# Patient Record
Sex: Male | Born: 1980 | Hispanic: No | Marital: Married | Smoking: Never smoker
Health system: Southern US, Community
[De-identification: ages and names within clinical notes are randomized; demographics above are authoritative.]

## PROBLEM LIST (undated history)

## (undated) DIAGNOSIS — N200 Calculus of kidney: Secondary | ICD-10-CM

## (undated) DIAGNOSIS — I10 Essential (primary) hypertension: Secondary | ICD-10-CM

## (undated) HISTORY — PX: URETERAL STENT PLACEMENT: SHX822

## (undated) HISTORY — PX: WISDOM TOOTH EXTRACTION: SHX21

## (undated) HISTORY — PX: VASECTOMY: SHX75

---

## 2017-12-29 ENCOUNTER — Encounter (HOSPITAL_BASED_OUTPATIENT_CLINIC_OR_DEPARTMENT_OTHER): Payer: Self-pay | Admitting: *Deleted

## 2017-12-29 ENCOUNTER — Other Ambulatory Visit: Payer: Self-pay

## 2017-12-29 ENCOUNTER — Emergency Department (HOSPITAL_BASED_OUTPATIENT_CLINIC_OR_DEPARTMENT_OTHER)
Admission: EM | Admit: 2017-12-29 | Discharge: 2017-12-30 | Disposition: A | Discharge status: Home / Self Care | Payer: 59 | Attending: Emergency Medicine | Admitting: Emergency Medicine

## 2017-12-29 ENCOUNTER — Emergency Department (HOSPITAL_BASED_OUTPATIENT_CLINIC_OR_DEPARTMENT_OTHER): Payer: 59

## 2017-12-29 DIAGNOSIS — I10 Essential (primary) hypertension: Secondary | ICD-10-CM | POA: Insufficient documentation

## 2017-12-29 DIAGNOSIS — Z87442 Personal history of urinary calculi: Secondary | ICD-10-CM | POA: Diagnosis not present

## 2017-12-29 DIAGNOSIS — R109 Unspecified abdominal pain: Secondary | ICD-10-CM | POA: Insufficient documentation

## 2017-12-29 DIAGNOSIS — R110 Nausea: Secondary | ICD-10-CM | POA: Diagnosis not present

## 2017-12-29 HISTORY — DX: Calculus of kidney: N20.0

## 2017-12-29 HISTORY — DX: Essential (primary) hypertension: I10

## 2017-12-29 LAB — URINALYSIS, ROUTINE W REFLEX MICROSCOPIC
Bilirubin Urine: NEGATIVE
Glucose, UA: NEGATIVE mg/dL
Hgb urine dipstick: NEGATIVE
KETONES UR: NEGATIVE mg/dL
LEUKOCYTES UA: NEGATIVE
NITRITE: NEGATIVE
Protein, ur: NEGATIVE mg/dL
Specific Gravity, Urine: >1.03 — ABNORMAL HIGH (ref 1.005–1.030)
pH: 6 (ref 5.0–8.0)

## 2017-12-29 MED ORDER — OXYCODONE-ACETAMINOPHEN 5-325 MG PO TABS
1.0000 | ORAL_TABLET | Freq: Once | ORAL | Status: AC
  Administered 2017-12-29: 1 via ORAL
  Filled 2017-12-29: qty 1

## 2017-12-29 MED ORDER — CYCLOBENZAPRINE HCL 10 MG PO TABS: 10.0000 mg | ORAL_TABLET | Freq: Three times a day (TID) | ORAL | 0 refills | Status: DC | PRN

## 2017-12-29 MED ORDER — HYDROCODONE-ACETAMINOPHEN 5-325 MG PO TABS: 1.0000 | ORAL_TABLET | Freq: Four times a day (QID) | ORAL | 0 refills | Status: DC | PRN
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
## 2017-12-29 NOTE — ED Triage Notes (Signed)
Pt reports right flank pain x 1 week. ?kidney infection/problem

## 2018-04-26 ENCOUNTER — Ambulatory Visit: Payer: 59 | Admitting: Family Medicine

## 2018-04-26 ENCOUNTER — Encounter: Payer: Self-pay | Admitting: Family Medicine

## 2018-04-26 ENCOUNTER — Other Ambulatory Visit: Payer: Self-pay

## 2018-04-26 VITALS — BP 140/100 | HR 73 | Temp 98.7°F | Ht 75.0 in | Wt 288.1 lb

## 2018-04-26 DIAGNOSIS — M542 Cervicalgia: Secondary | ICD-10-CM | POA: Diagnosis not present

## 2018-04-26 DIAGNOSIS — I10 Essential (primary) hypertension: Secondary | ICD-10-CM | POA: Diagnosis not present

## 2018-04-26 LAB — BASIC METABOLIC PANEL
BUN: 16 mg/dL (ref 6–23)
CO2: 26 mEq/L (ref 19–32)
Calcium: 9.8 mg/dL (ref 8.4–10.5)
Chloride: 103 mEq/L (ref 96–112)
Creatinine, Ser: 1.04 mg/dL (ref 0.40–1.50)
GFR: 79.59 mL/min (ref >60.00)
GLUCOSE: 76 mg/dL (ref 70–99)
POTASSIUM: 4.5 meq/L (ref 3.5–5.1)
Sodium: 138 mEq/L (ref 135–145)

## 2018-04-26 MED ORDER — LISINOPRIL 20 MG PO TABS: 20.0000 mg | ORAL_TABLET | Freq: Every day | ORAL | 3 refills | Status: DC

## 2018-04-26 NOTE — Progress Notes (Signed)
Chief Complaint  Patient presents with  . New Patient (Initial Visit)       New Patient Visit SUBJECTIVE: HPI: Ryan Casey is an 38 y.o.male who is being seen for establishing care.  Hypertension Patient presents for hypertension follow up. He does not currently monitor home blood pressures. He is not currently on any medication.  He is usually adhering to a healthy diet overall-is a snacker. Exercise: nothing  Several mo hx of RUE paresthesias. A/w R sided neck pain. No inj or change in activity. Positionally related. Nothing on inside of arm, outside. Has a wrist splint. No weakness.  No Known Allergies  Past Medical History:  Diagnosis Date  . Hypertension   . Kidney stone    Past Surgical History:  Procedure Laterality Date  . URETERAL STENT PLACEMENT    . VASECTOMY    . WISDOM TOOTH EXTRACTION     Family History  Problem Relation Age of Onset  . Heart disease Mother   . Hyperlipidemia Father    No Known Allergies  Current Outpatient Medications:  .  lisinopril (PRINIVIL,ZESTRIL) 20 MG tablet, Take 1 tablet (20 mg total) by mouth daily., Disp: 30 tablet, Rfl: 3  ROS Cardiovascular: Denies chest pain  Respiratory: Denies dyspnea   OBJECTIVE: BP (!) 140/100 (BP Location: Left Arm, Patient Position: Sitting, Cuff Size: Large)   Pulse 73   Temp 98.7 F (37.1 C) (Oral)   Ht 6\' 3"  (1.905 m)   Wt 288 lb 2 oz (130.7 kg)   SpO2 97%   BMI 36.01 kg/m   Constitutional: -  VS reviewed -  Well developed, well nourished, appears stated age -  No apparent distress  Psychiatric: -  Oriented to person, place, and time -  Memory intact -  Affect and mood normal -  Fluent conversation, good eye contact -  Judgment and insight age appropriate  Eye: -  Conjunctivae clear, no discharge -  Pupils symmetric, round, reactive to light  Neck: -  No gross swelling, no palpable masses -  Thyroid midline, not enlarged, mobile, no palpable masses  Cardiovascular: -  RRR -  No  bruits -  No LE edema  Respiratory: -  Normal respiratory effort, no accessory muscle use, no retraction -  Breath sounds equal, no wheezes, no ronchi, no crackles  Neurological:  -  CN II - XII grossly intact -  Neg Phalen's and Tinel's -  Sensation intact to pinprick, equal bilaterally  Musculoskeletal: -  No clubbing, no cyanosis -  Gait normal  Skin: -  No significant lesion on inspection -  Warm and dry to palpation   ASSESSMENT/PLAN: Essential hypertension - Plan: Basic metabolic panel, Basic metabolic panel  Neck pain  Restart ACEi. Counseled on diet and exercise. Monitor BP at home. Healthy diet handout given, offered to refer to dietician.  Not in right location for neurogen TOS. Will tx neck issue and see if it resolves issue. Heat, stretches/exercises. Will refer to sports med if no improvement.  Patient should return in 1 mo for HTN ck, 1 week for labs. The patient voiced understanding and agreement to the plan.   Jilda Roche [place], DO 04/26/18  8:31 AM

## 2018-04-26 NOTE — Patient Instructions (Addendum)
Heat (pad or rice pillow in microwave) over affected area, 10-15 minutes twice daily.   Keep the diet clean and stay active.  Aim to do some physical exertion for 150 minutes per week. This is typically divided into 5 days per week, 30 minutes per day. The activity should be enough to get your heart rate up. Anything is better than nothing if you have time constraints.  If you want to see a nutritionist/dietician at any time, let me know.   Healthy Eating Plan Many factors influence your heart health, including eating and exercise habits. Heart (coronary) risk increases with abnormal blood fat (lipid) levels. Heart-healthy meal planning includes limiting unhealthy fats, increasing healthy fats, and making other small dietary changes. This includes maintaining a healthy body weight to help keep lipid levels within a normal range.  WHAT IS MY PLAN?  Your health care provider recommends that you:  Drink a glass of water before meals to help with satiety.  Eat slowly.  An alternative to the water is to add Metamucil. This will help with satiety as well. It does contain calories, unlike water.  WHAT TYPES OF FAT SHOULD I CHOOSE?  Choose healthy fats more often. Choose monounsaturated and polyunsaturated fats, such as olive oil and canola oil, flaxseeds, walnuts, almonds, and seeds.  Eat more omega-3 fats. Good choices include salmon, mackerel, sardines, tuna, flaxseed oil, and ground flaxseeds. Aim to eat fish at least two times each week.  Avoid foods with partially hydrogenated oils in them. These contain trans fats. Examples of foods that contain trans fats are stick margarine, some tub margarines, cookies, crackers, and other baked goods. If you are going to avoid a fat, this is the one to avoid!  WHAT GENERAL GUIDELINES DO I NEED TO FOLLOW?  Check food labels carefully to identify foods with trans fats. Avoid these types of options when possible.  Fill one half of your plate with  vegetables and green salads. Eat 4-5 servings of vegetables per day. A serving of vegetables equals 1 cup of raw leafy vegetables,  cup of raw or cooked cut-up vegetables, or  cup of vegetable juice.  Fill one fourth of your plate with whole grains. Look for the word "whole" as the first word in the ingredient list.  Fill one fourth of your plate with lean protein foods.  Eat 4-5 servings of fruit per day. A serving of fruit equals one medium whole fruit,  cup of dried fruit,  cup of fresh, frozen, or canned fruit. Try to avoid fruits in cups/syrups as the sugar content can be high.  Eat more foods that contain soluble fiber. Examples of foods that contain this type of fiber are apples, broccoli, carrots, beans, peas, and barley. Aim to get 20-30 g of fiber per day.  Eat more home-cooked food and less restaurant, buffet, and fast food.  Limit or avoid alcohol.  Limit foods that are high in starch and sugar.  Avoid fried foods when able.  Cook foods by using methods other than frying. Baking, boiling, grilling, and broiling are all great options. Other fat-reducing suggestions include: ? Removing the skin from poultry. ? Removing all visible fats from meats. ? Skimming the fat off of stews, soups, and gravies before serving them. ? Steaming vegetables in water or broth.  Lose weight if you are overweight. Losing just 5-10% of your initial body weight can help your overall health and prevent diseases such as diabetes and heart disease.  Increase your consumption  of nuts, legumes, and seeds to 4-5 servings per week. One serving of dried beans or legumes equals  cup after being cooked, one serving of nuts equals 1 ounces, and one serving of seeds equals  ounce or 1 tablespoon.  WHAT ARE GOOD FOODS CAN I EAT? Grains Grainy breads (try to find bread that is 3 g of fiber per slice or greater), oatmeal, light popcorn. Whole-grain cereals. Rice and pasta, including brown rice and those  that are made with whole wheat. Edamame pasta is a great alternative to grain pasta. It has a higher protein content. Try to avoid significant consumption of white bread, sugary cereals, or pastries/baked goods.  Vegetables All vegetables. Cooked white potatoes do not count as vegetables.  Fruits All fruits, but limit pineapple and bananas as these fruits have a higher sugar content.  Meats and Other Protein Sources Lean, well-trimmed beef, veal, pork, and lamb. Chicken and [place] without skin. All fish and shellfish. Wild duck, rabbit, pheasant, and venison. Egg whites or low-cholesterol egg substitutes. Dried beans, peas, lentils, and tofu.Seeds and most nuts.  Dairy Low-fat or nonfat cheeses, including ricotta, string, and mozzarella. Skim or 1% milk that is liquid, powdered, or evaporated. Buttermilk that is made with low-fat milk. Nonfat or low-fat yogurt. Soy/Almond milk are good alternatives if you cannot handle dairy.  Beverages Water is the best for you. Sports drinks with less sugar are more desirable unless you are a highly active athlete.  Sweets and Desserts Sherbets and fruit ices. Honey, jam, marmalade, jelly, and syrups. Dark chocolate.  Eat all sweets and desserts in moderation.  Fats and Oils Nonhydrogenated (trans-free) margarines. Vegetable oils, including soybean, sesame, sunflower, olive, peanut, safflower, corn, canola, and cottonseed. Salad dressings or mayonnaise that are made with a vegetable oil. Limit added fats and oils that you use for cooking, baking, salads, and as spreads.  Other Cocoa powder. Coffee and tea. Most condiments.  The items listed above may not be a complete list of recommended foods or beverages. Contact your dietitian for more options.   EXERCISES RANGE OF MOTION (ROM) AND STRETCHING EXERCISES  These exercises may help you when beginning to rehabilitate your issue. In order to successfully resolve your symptoms, you must improve your  posture. These exercises are designed to help reduce the forward-head and rounded-shoulder posture which contributes to this condition. Your symptoms may resolve with or without further involvement from your physician, physical therapist or athletic trainer. While completing these exercises, remember:   Restoring tissue flexibility helps normal motion to return to the joints. This allows healthier, less painful movement and activity.  An effective stretch should be held for at least 20 seconds, although you may need to begin with shorter hold times for comfort.  A stretch should never be painful. You should only feel a gentle lengthening or release in the stretched tissue.  Do not do any stretch or exercise that you cannot tolerate.  STRETCH- Axial Extensors  Lie on your back on the floor. You may bend your knees for comfort. Place a rolled-up hand towel or dish towel, about 2 inches in diameter, under the part of your head that makes contact with the floor.  Gently tuck your chin, as if trying to make a "double chin," until you feel a gentle stretch at the base of your head.  Hold 15-20 seconds. Repeat 2-3 times. Complete this exercise 1 time per day.   STRETCH - Axial Extension   Stand or sit on a firm  surface. Assume a good posture: chest up, shoulders drawn back, abdominal muscles slightly tense, knees unlocked (if standing) and feet hip width apart.  Slowly retract your chin so your head slides back and your chin slightly lowers. Continue to look straight ahead.  You should feel a gentle stretch in the back of your head. Be certain not to feel an aggressive stretch since this can cause headaches later.  Hold for 15-20 seconds. Repeat 2-3 times. Complete this exercise 1 time per day.  STRETCH - Cervical Side Bend   Stand or sit on a firm surface. Assume a good posture: chest up, shoulders drawn back, abdominal muscles slightly tense, knees unlocked (if standing) and feet hip width  apart.  Without letting your nose or shoulders move, slowly tip your right / left ear to your shoulder until your feel a gentle stretch in the muscles on the opposite side of your neck.  Hold 15-20 seconds. Repeat 2-3 times. Complete this exercise 1-2 times per day.  STRETCH - Cervical Rotators   Stand or sit on a firm surface. Assume a good posture: chest up, shoulders drawn back, abdominal muscles slightly tense, knees unlocked (if standing) and feet hip width apart.  Keeping your eyes level with the ground, slowly turn your head until you feel a gentle stretch along the back and opposite side of your neck.  Hold 15-20 seconds. Repeat 2-3 times. Complete this exercise 1-2 times per day.  RANGE OF MOTION - Neck Circles   Stand or sit on a firm surface. Assume a good posture: chest up, shoulders drawn back, abdominal muscles slightly tense, knees unlocked (if standing) and feet hip width apart.  Gently roll your head down and around from the back of one shoulder to the back of the other. The motion should never be forced or painful.  Repeat the motion 10-20 times, or until you feel the neck muscles relax and loosen. Repeat 2-3 times. Complete the exercise 1-2 times per day. STRENGTHENING EXERCISES - Cervical Strain and Sprain These exercises may help you when beginning to rehabilitate your injury. They may resolve your symptoms with or without further involvement from your physician, physical therapist, or athletic trainer. While completing these exercises, remember:   Muscles can gain both the endurance and the strength needed for everyday activities through controlled exercises.  Complete these exercises as instructed by your physician, physical therapist, or athletic trainer. Progress the resistance and repetitions only as guided.  You may experience muscle soreness or fatigue, but the pain or discomfort you are trying to eliminate should never worsen during these exercises. If this  pain does worsen, stop and make certain you are following the directions exactly. If the pain is still present after adjustments, discontinue the exercise until you can discuss the trouble with your clinician.  STRENGTH - Cervical Flexors, Isometric  Face a wall, standing about 6 inches away. Place a small pillow, a ball about 6-8 inches in diameter, or a folded towel between your forehead and the wall.  Slightly tuck your chin and gently push your forehead into the soft object. Push only with mild to moderate intensity, building up tension gradually. Keep your jaw and forehead relaxed.  Hold 10 to 20 seconds. Keep your breathing relaxed.  Release the tension slowly. Relax your neck muscles completely before you start the next repetition. Repeat 2-3 times. Complete this exercise 1 time per day.  STRENGTH- Cervical Lateral Flexors, Isometric   Stand about 6 inches away from a wall.  Place a small pillow, a ball about 6-8 inches in diameter, or a folded towel between the side of your head and the wall.  Slightly tuck your chin and gently tilt your head into the soft object. Push only with mild to moderate intensity, building up tension gradually. Keep your jaw and forehead relaxed.  Hold 10 to 20 seconds. Keep your breathing relaxed.  Release the tension slowly. Relax your neck muscles completely before you start the next repetition. Repeat 2-3 times. Complete this exercise 1 time per day.  STRENGTH - Cervical Extensors, Isometric   Stand about 6 inches away from a wall. Place a small pillow, a ball about 6-8 inches in diameter, or a folded towel between the back of your head and the wall.  Slightly tuck your chin and gently tilt your head back into the soft object. Push only with mild to moderate intensity, building up tension gradually. Keep your jaw and forehead relaxed.  Hold 10 to 20 seconds. Keep your breathing relaxed.  Release the tension slowly. Relax your neck muscles  completely before you start the next repetition. Repeat 2-3 times. Complete this exercise 1 time per day.  POSTURE AND BODY MECHANICS CONSIDERATIONS Keeping correct posture when sitting, standing or completing your activities will reduce the stress put on different body tissues, allowing injured tissues a chance to heal and limiting painful experiences. The following are general guidelines for improved posture. Your physician or physical therapist will provide you with any instructions specific to your needs. While reading these guidelines, remember:  The exercises prescribed by your provider will help you have the flexibility and strength to maintain correct postures.  The correct posture provides the optimal environment for your joints to work. All of your joints have less wear and tear when properly supported by a spine with good posture. This means you will experience a healthier, less painful body.  Correct posture must be practiced with all of your activities, especially prolonged sitting and standing. Correct posture is as important when doing repetitive low-stress activities (typing) as it is when doing a single heavy-load activity (lifting).  PROLONGED STANDING WHILE SLIGHTLY LEANING FORWARD When completing a task that requires you to lean forward while standing in one place for a long time, place either foot up on a stationary 2- to 4-inch high object to help maintain the best posture. When both feet are on the ground, the low back tends to lose its slight inward curve. If this curve flattens (or becomes too large), then the back and your other joints will experience too much stress, fatigue more quickly, and can cause pain.   RESTING POSITIONS Consider which positions are most painful for you when choosing a resting position. If you have pain with flexion-based activities (sitting, bending, stooping, squatting), choose a position that allows you to rest in a less flexed posture. You would  want to avoid curling into a fetal position on your side. If your pain worsens with extension-based activities (prolonged standing, working overhead), avoid resting in an extended position such as sleeping on your stomach. Most people will find more comfort when they rest with their spine in a more neutral position, neither too rounded nor too arched. Lying on a non-sagging bed on your side with a pillow between your knees, or on your back with a pillow under your knees will often provide some relief. Keep in mind, being in any one position for a prolonged period of time, no matter how correct your posture, can  still lead to stiffness.  WALKING Walk with an upright posture. Your ears, shoulders, and hips should all line up. OFFICE WORK When working at a desk, create an environment that supports good, upright posture. Without extra support, muscles fatigue and lead to excessive strain on joints and other tissues.  CHAIR:  A chair should be able to slide under your desk when your back makes contact with the back of the chair. This allows you to work closely.  The chair's height should allow your eyes to be level with the upper part of your monitor and your hands to be slightly lower than your elbows.  Body position: ? Your feet should make contact with the floor. If this is not possible, use a foot rest. ? Keep your ears over your shoulders. This will reduce stress on your neck and low back.

## 2018-05-29 ENCOUNTER — Ambulatory Visit: Payer: 59 | Admitting: Family Medicine

## 2018-06-18 ENCOUNTER — Encounter: Payer: Self-pay | Admitting: Family Medicine

## 2018-06-19 ENCOUNTER — Other Ambulatory Visit: Payer: Self-pay

## 2018-06-19 ENCOUNTER — Ambulatory Visit (INDEPENDENT_AMBULATORY_CARE_PROVIDER_SITE_OTHER): Payer: 59 | Admitting: Family Medicine

## 2018-06-19 ENCOUNTER — Encounter: Payer: Self-pay | Admitting: Family Medicine

## 2018-06-19 DIAGNOSIS — I10 Essential (primary) hypertension: Secondary | ICD-10-CM | POA: Diagnosis not present

## 2018-06-19 DIAGNOSIS — N50812 Left testicular pain: Secondary | ICD-10-CM

## 2018-06-19 DIAGNOSIS — G4731 Primary central sleep apnea: Secondary | ICD-10-CM | POA: Insufficient documentation

## 2018-06-19 DIAGNOSIS — R0681 Apnea, not elsewhere classified: Secondary | ICD-10-CM

## 2018-06-19 NOTE — Progress Notes (Addendum)
Chief Complaint  Patient presents with  . Hypertension    Subjective Ryan Casey is a 38 y.o. male who presents for hypertension follow up. Due to COVID-19 pandemic, we are interacting via web portal for an electronic face-to-face visit. I verified patient's ID using 2 identifiers. Patient agreed to proceed with visit via this method. Patient is at home, I am at office. Patient and I are present for visit.  He does monitor home blood pressures. Blood pressures ranging from 120-160's/90-100's on average. He is not compliant with medication- lisinopril 20 mg/d. His work schedule causes him to forget or miss doses.  Patient has these side effects of medication: none He is adhering to a healthy diet overall. Current exercise: none  Wife tells him that he snores at night. He also stops breathing per her as well. He has never had a sleep study. +fatigue, but he also works both nights and days depending on the week.  +L testicle pain over past year. No inj or change in activity, did have a vasectomy and feels bumps from after the procedure. 1 sexual partner in wife, denies skin lesions, fevers, or urinary complaints. He wears boxer briefs with reported good support. He is not a cyclist.   Past Medical History:  Diagnosis Date  . Hypertension   . Kidney stone     Review of Systems Cardiovascular: no chest pain Respiratory:  no shortness of breath  Exam No conversational dyspnea Age appropriate judgment and insight Nml affect and mood   Essential hypertension  Witnessed apneic spells - Plan: Ambulatory referral to Pulmonology  Left testicular pain  1- Counseled on diet and exercise. Keep ACEi the same for now, pt would like to buckle down w compliance for now. He will let us know if BP does not come down. 2- probably OSA, refer to sleep team. Could help with #1 3- questionable etiology, likely not torsion or infection given duration. Ice, NSAIDs, Tylenol, wear supportive  undergarments over next 1-2 weeks. If no improvement, will refer to urology, also offered to bring him in for more formal eval and urine.  F/u in 6 mo for CPE if all else is controlled. The patient voiced understanding and agreement to the plan.  Jilda Roche [place], DO 06/19/18  3:49 PM

## 2018-07-11 ENCOUNTER — Ambulatory Visit: Payer: 59 | Admitting: Internal Medicine

## 2018-07-11 ENCOUNTER — Encounter: Payer: Self-pay | Admitting: Internal Medicine

## 2018-07-11 ENCOUNTER — Other Ambulatory Visit: Payer: Self-pay

## 2018-07-11 VITALS — BP 140/80 | HR 73 | Temp 98.0°F | Ht 75.0 in | Wt 288.0 lb

## 2018-07-11 DIAGNOSIS — G4733 Obstructive sleep apnea (adult) (pediatric): Secondary | ICD-10-CM | POA: Diagnosis not present

## 2018-07-11 DIAGNOSIS — I10 Essential (primary) hypertension: Secondary | ICD-10-CM | POA: Diagnosis not present

## 2018-07-11 DIAGNOSIS — R0681 Apnea, not elsewhere classified: Secondary | ICD-10-CM | POA: Diagnosis not present

## 2018-07-11 NOTE — Progress Notes (Signed)
07/11/2018  37 yoM never smoker for sleep evaluation. referred by Dr. Carmelia Roller (PCP) for witnessed apneic episodes by spouse; pt reports having difficulties falling asleep, reports "broken sleep". 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 Medical problem list includes HBP Epworth score 8 Body weight today 288 lbs Rotates 12 hour day/night schedule every 2 weeks. Has black-out curtains, white noise, melatonin, no caffeine.  "Hate taking medicines" and job requires drug screen. Loud snoring.  No ENT surgery, heart or lung disease.  Prior to Admission medications   Medication Sig Start Date End Date Taking? Authorizing Provider  lisinopril (PRINIVIL,ZESTRIL) 20 MG tablet Take 1 tablet (20 mg total) by mouth daily. 04/26/18   Sharlene Dory, DO   Past Medical History:  Diagnosis Date  . Hypertension   . Kidney stone    Past Surgical History:  Procedure Laterality Date  . URETERAL STENT PLACEMENT    . VASECTOMY    . WISDOM TOOTH EXTRACTION     Family History  Problem Relation Age of Onset  . Heart disease Mother   . Hyperlipidemia Father    Social History   Socioeconomic History  . Marital status: Married    Spouse name: Not on file  . Number of children: Not on file  . Years of education: Not on file  . Highest education level: Not on file  Occupational History  . Not on file  Social Needs  . Financial resource strain: Not on file  . Food insecurity:    Worry: Not on file    Inability: Not on file  . Transportation needs:    Medical: Not on file    Non-medical: Not on file  Tobacco Use  . Smoking status: Never Smoker  . Smokeless tobacco: Current User    Types: Snuff  Substance and Sexual Activity  . Alcohol use: Not Currently  . Drug use: Never  . Sexual activity: Not on file  Lifestyle  . Physical activity:    Days per week: Not on file    Minutes per session: Not on file  . Stress: Not on file  Relationships  . Social connections:    Talks on  phone: Not on file    Gets together: Not on file    Attends religious service: Not on file    Active member of club or organization: Not on file    Attends meetings of clubs or organizations: Not on file    Relationship status: Not on file  . Intimate partner violence:    Fear of current or ex partner: Not on file    Emotionally abused: Not on file    Physically abused: Not on file    Forced sexual activity: Not on file  Other Topics Concern  . Not on file  Social History Narrative  . Not on file   ROS-see HPI   + = positive Constitutional:    weight loss, night sweats, fevers, chills, fatigue, lassitude. HEENT:    headaches, difficulty swallowing, tooth/dental problems, sore throat,       sneezing, itching, ear ache, nasal congestion, post nasal drip, snoring CV:    chest pain, orthopnea, PND, swelling in lower extremities, anasarca,                                  dizziness, palpitations Resp:   shortness of breath with exertion or at rest.  productive cough,   non-productive cough, coughing up of blood.              change in color of mucus.  wheezing.   Skin:    rash or lesions. GI:  No-   heartburn, indigestion, abdominal pain, nausea, vomiting, diarrhea,                 change in bowel habits, loss of appetite GU: dysuria, change in color of urine, no urgency or frequency.   flank pain. MS:   joint pain, stiffness, decreased range of motion, back pain. Neuro-     nothing unusual Psych:  change in mood or affect.  depression or anxiety.   memory loss.  OBJ- Physical Exam General- Alert, Oriented, Affect-appropriate, Distress- none acute, stocky/ overweight Skin- rash-none, lesions- none, excoriation- none Lymphadenopathy- none Head- atraumatic            Eyes- Gross vision intact, PERRLA, conjunctivae and secretions clear            Ears- Hearing, canals-normal            Nose- Clear, no-Septal dev, mucus, polyps, erosion, perforation             Throat-  Mallampati III , mucosa clear , drainage- none, tonsils- atrophic Neck- flexible , trachea midline, no stridor , thyroid nl, carotid no bruit Chest - symmetrical excursion , unlabored           Heart/CV- RRR , no murmur , no gallop  , no rub, nl s1 s2                           - JVD- none , edema- none, stasis changes- none, varices- none           Lung- clear to P&A, wheeze- none, cough- none , dullness-none, rub- none           Chest wall-  Abd-  Br/ Gen/ Rectal- Not done, not indicated Extrem- cyanosis- none, clubbing, none, atrophy- none, strength- nl Neuro- grossly intact to observation

## 2018-07-11 NOTE — Patient Instructions (Signed)
Order- please schedule unattended home sleep test    Dx OSA  Please call me about 2 weeks after your sleep test, for results and recommendations. If appropriate, we may be able to start treatment before we see you next.  

## 2018-07-12 NOTE — Assessment & Plan Note (Signed)
Discussed relationship of OSA toHBP

## 2018-07-12 NOTE — Assessment & Plan Note (Signed)
High probability he has medically significant OSA. Appropriate discussion, education/ responsibility to drive safely.  Plan- sleep study, then likely CPAP

## 2018-07-29 ENCOUNTER — Ambulatory Visit: Payer: 59

## 2018-07-29 ENCOUNTER — Other Ambulatory Visit: Payer: Self-pay

## 2018-07-29 DIAGNOSIS — G4733 Obstructive sleep apnea (adult) (pediatric): Secondary | ICD-10-CM

## 2018-07-31 ENCOUNTER — Encounter: Payer: Self-pay | Admitting: Family Medicine

## 2018-07-31 DIAGNOSIS — G4733 Obstructive sleep apnea (adult) (pediatric): Secondary | ICD-10-CM | POA: Diagnosis not present

## 2018-08-01 ENCOUNTER — Other Ambulatory Visit: Payer: Self-pay

## 2018-08-01 ENCOUNTER — Telehealth: Payer: Self-pay | Admitting: Family Medicine

## 2018-08-01 ENCOUNTER — Telehealth: Payer: Self-pay | Admitting: *Deleted

## 2018-08-01 DIAGNOSIS — Z20822 Contact with and (suspected) exposure to covid-19: Secondary | ICD-10-CM

## 2018-08-01 NOTE — Telephone Encounter (Signed)
Pt scheduled for covid testing today @ 11:00am @ [facility]. Instructions given and order placed.

## 2018-08-01 NOTE — Telephone Encounter (Signed)
Please set this patient up for testing.

## 2018-08-05 LAB — NOVEL CORONAVIRUS, NAA: SARS-CoV-2, NAA: NOT DETECTED

## 2018-08-14 ENCOUNTER — Telehealth: Payer: Self-pay | Admitting: Internal Medicine

## 2018-08-14 DIAGNOSIS — G4733 Obstructive sleep apnea (adult) (pediatric): Secondary | ICD-10-CM

## 2018-08-14 NOTE — Telephone Encounter (Signed)
Pt had sleep study performed 6/22 and is calling requesting to know the results. Dr. Annamaria Boots, please advise on this. Thank you!

## 2018-08-15 NOTE — Telephone Encounter (Signed)
Called & spoke w/ pt regarding CY's results and recommendations. Pt verbalized understanding with no additional questions. Order for new CPAP/DME start has been placed. Nothing further needed at this time.

## 2018-08-15 NOTE — Telephone Encounter (Signed)
Home sleep test showed moderately severe obstructive sleep apnea, averaging 22 apneas/ hour, with drops in blood oxygen level. As discussed at office visit, I recommend we start CPAP.  Order- new DME, new CPAP auto 5-20, mask of choice, humidifier, supplies, AirView Please verify that he has a return ov scheduled in 31-90 days per insurance regs.

## 2018-08-19 ENCOUNTER — Encounter: Payer: Self-pay | Admitting: Family Medicine

## 2018-08-20 ENCOUNTER — Other Ambulatory Visit: Payer: Self-pay | Admitting: Family Medicine

## 2018-08-20 ENCOUNTER — Telehealth: Payer: Self-pay | Admitting: Family Medicine

## 2018-08-20 MED ORDER — LISINOPRIL 20 MG PO TABS: 20.0000 mg | ORAL_TABLET | Freq: Every day | ORAL | 3 refills | Status: DC

## 2018-08-20 NOTE — Telephone Encounter (Signed)
Patient sent message today to see if DME can be changed to [facility]. Message below.  I still haven't heard anything regarding my cpap machine. If possible will you send the order to [facility]. My wife uses them and they seem to be pretty good with her supplies and cpap machine.   Thanks   Message routed to [facility] to advise

## 2018-08-20 NOTE — Telephone Encounter (Signed)
Refill done.  

## 2018-08-20 NOTE — Telephone Encounter (Signed)
I sent message to Sonia Baller at [facility] to disregard cpap order and I will send order to [facility].  Please send MyChart message to pt to make him aware.

## 2018-10-31 ENCOUNTER — Encounter: Payer: Self-pay | Admitting: Internal Medicine

## 2018-11-04 ENCOUNTER — Other Ambulatory Visit: Payer: Self-pay

## 2018-11-04 ENCOUNTER — Ambulatory Visit: Payer: 59 | Admitting: Internal Medicine

## 2018-11-04 ENCOUNTER — Encounter: Payer: Self-pay | Admitting: Internal Medicine

## 2018-11-04 DIAGNOSIS — G4731 Primary central sleep apnea: Secondary | ICD-10-CM | POA: Diagnosis not present

## 2018-11-04 DIAGNOSIS — G4726 Circadian rhythm sleep disorder, shift work type: Secondary | ICD-10-CM | POA: Insufficient documentation

## 2018-11-04 NOTE — Assessment & Plan Note (Signed)
CPAP download shows residual AHI 3.2/ hr, with no significant central component. Appropriate to treat this as OSA. He benefits from CPAP. Plan- continue CPAP auto 5-20. Education done re goals, comfort.

## 2018-11-04 NOTE — Patient Instructions (Signed)
We can continue CPAP auto 5-20, mask of choice, humidifier,supplies, Airview/ card  Please call if we can help 

## 2018-11-04 NOTE — Progress Notes (Signed)
07/11/2018  37 yoM never smoker for sleep evaluation. referred by Dr. Nani Ravens (PCP) for witnessed apneic episodes by spouse; pt reports having difficulties falling asleep, reports "broken sleep". 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 Medical problem list includes HBP Epworth score 8 Body weight today 288 lbs Rotates 12 hour day/night schedule every 2 weeks. Has black-out curtains, white noise, melatonin, no caffeine.  "Hate taking medicines" and job requires drug screen. Loud snoring.  No ENT surgery, heart or lung disease.  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 Complicated by Obesity, HBP. HST- 07/29/2018- AHI 22.3/ hr, Complex (Central> Obstructive), desaturation to 78%, body weight 288 lbs CPAP  5-20/ [facility] -----OSA on CPAP, DME: [facility]; no complaints, reports not sleeping with machine for the past few nights d/t working swing shift Body weight today 279 lbs Download compliance 73%, AHI 3.2/ hr.  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  Dieted off 9 lbs.  Declines flu vax.  ROS-see HPI   + = positive Constitutional:    +weight loss, night sweats, fevers, chills, fatigue, lassitude. HEENT:    headaches, difficulty swallowing, tooth/dental problems, sore throat,       sneezing, itching, ear ache, nasal congestion, post nasal drip, snoring CV:    chest pain, orthopnea, PND, swelling in lower extremities, anasarca,                                  dizziness, palpitations Resp:   shortness of breath with exertion or at rest.                productive cough,   non-productive cough, coughing up of blood.              change in color of mucus.  wheezing.   Skin:    rash or lesions. GI:  No-   heartburn, indigestion, abdominal pain, nausea, vomiting, diarrhea,                change in bowel habits, loss of appetite GU: dysuria, change in color of urine, no urgency or frequency.   flank pain. MS:   joint pain, stiffness, decreased range of motion, back pain. Neuro-     nothing unusual Psych:  change in mood or affect.  depression or anxiety.   memory loss.  OBJ- Physical Exam General- Alert, Oriented, Affect-appropriate, Distress- none acute, tall/ muscular Skin- rash-none, lesions- none, excoriation- none Lymphadenopathy- none Head- atraumatic            Eyes- Gross vision intact, PERRLA, conjunctivae and secretions clear            Ears- Hearing, canals-normal            Nose- Clear, no-Septal dev, mucus, polyps, erosion, perforation             Throat- Mallampati III , mucosa clear , drainage- none, tonsils- atrophic Neck- flexible , trachea midline, no stridor , thyroid nl, carotid no bruit Chest - symmetrical excursion , unlabored           Heart/CV- RRR , no murmur , no gallop  , no rub, nl s1 s2                           -  JVD- none , edema- none, stasis changes- none, varices- none           Lung- clear to P&A, wheeze- none, cough- none , dullness-none, rub- none           Chest wall-  Abd-  Br/ Gen/ Rectal- Not done, not indicated Extrem- cyanosis- none, clubbing, none, atrophy- none, strength- nl Neuro- grossly intact to observation

## 2018-11-04 NOTE — Assessment & Plan Note (Signed)
Reviewed sleep hygiene measures 

## 2019-11-04 ENCOUNTER — Ambulatory Visit: Payer: 59 | Admitting: Internal Medicine

## 2019-12-12 IMAGING — CT CT RENAL STONE PROTOCOL
2 of 4 series · 17 of 46 positions shown, 19 images · non-contrast
Comparison: None.

CLINICAL DATA: Right flank pain for 1 week. History of kidney
stones and hypertension.

EXAM:
CT ABDOMEN AND PELVIS WITHOUT CONTRAST
TECHNIQUE: Multidetector CT imaging of the abdomen and pelvis was performed
following the standard protocol without IV contrast.

[Series 2: axial st · axial · 0.86mm/px · z∈[-493,-38]mm · 14 of 101 slices shown, 16 images]
[im 5/101  soft-tissue]
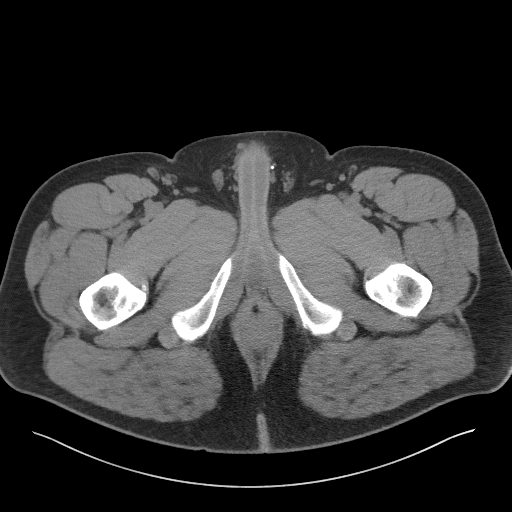
[im 5/101  bone]
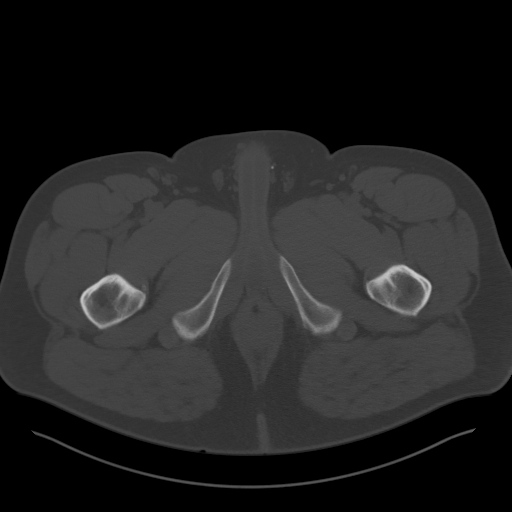
[im 13/101  soft-tissue]
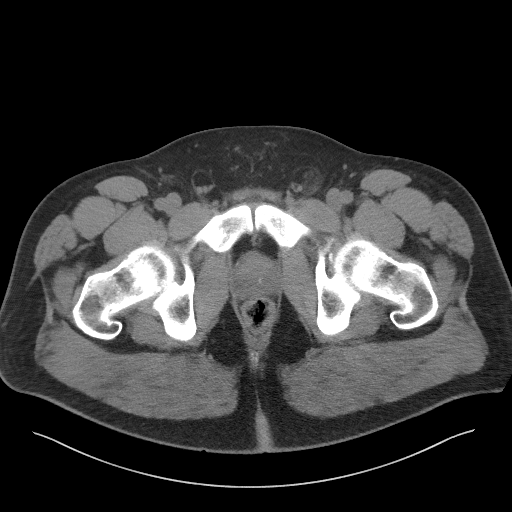
[im 21/101  soft-tissue]
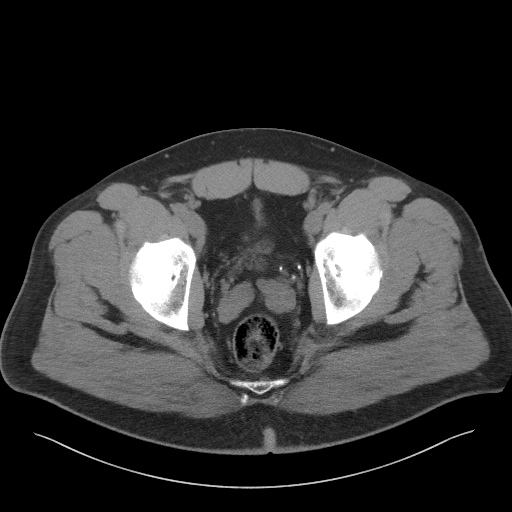
[im 26/101  soft-tissue]
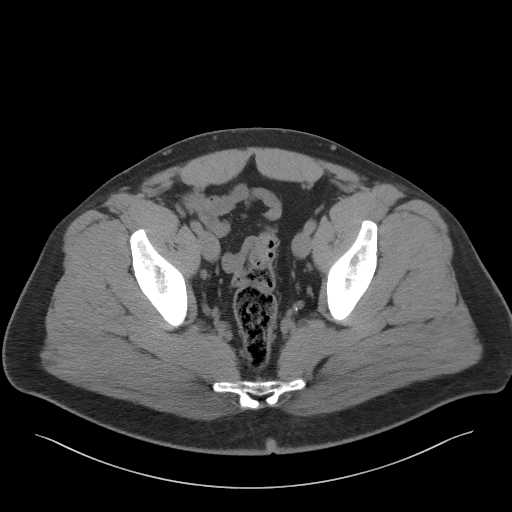
[im 34/101  soft-tissue]
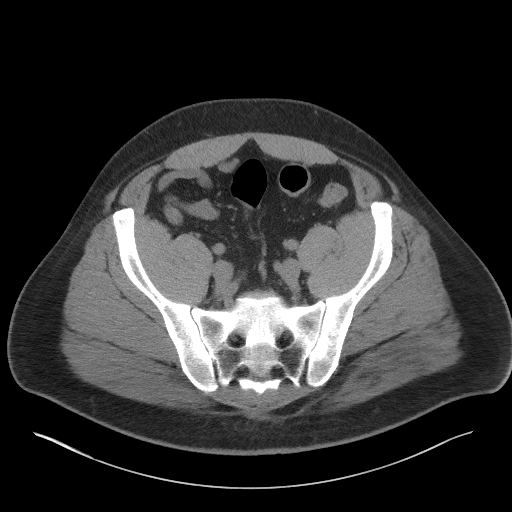
[im 42/101  soft-tissue]
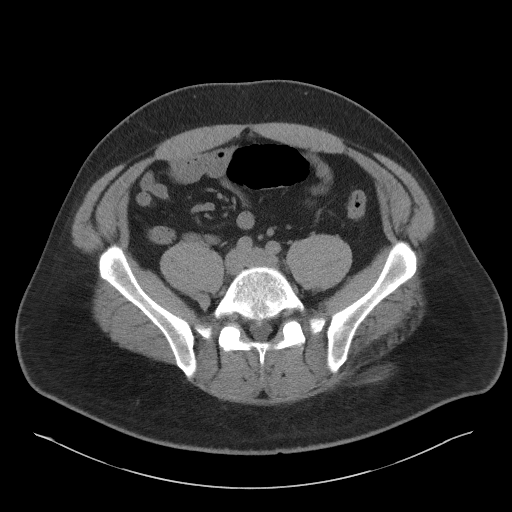
[im 46/101  soft-tissue]
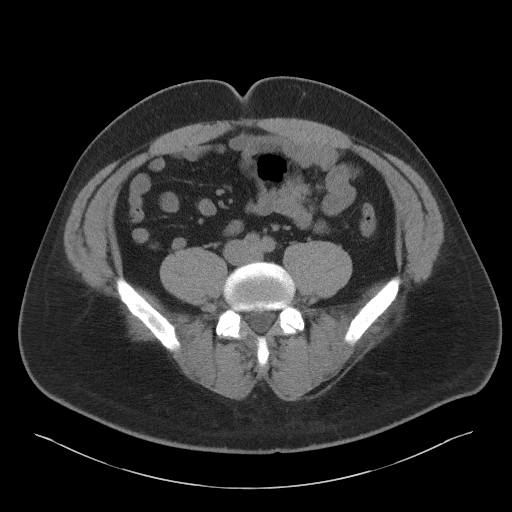
[im 55/101  soft-tissue]
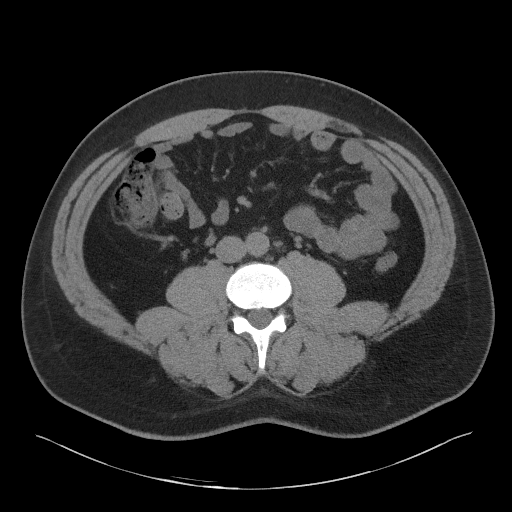
[im 59/101  soft-tissue]
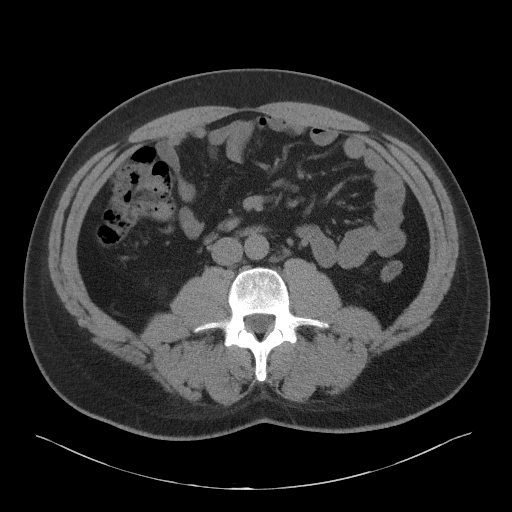
[im 59/101  bone]
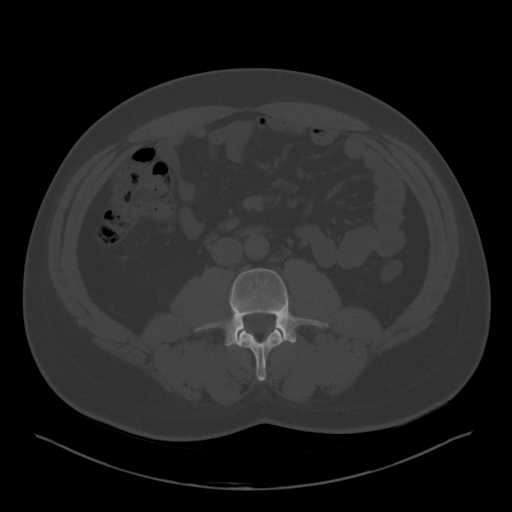
[im 67/101  soft-tissue]
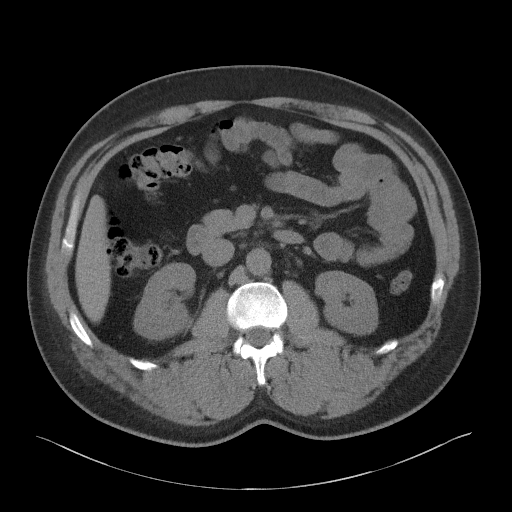
[im 76/101  soft-tissue]
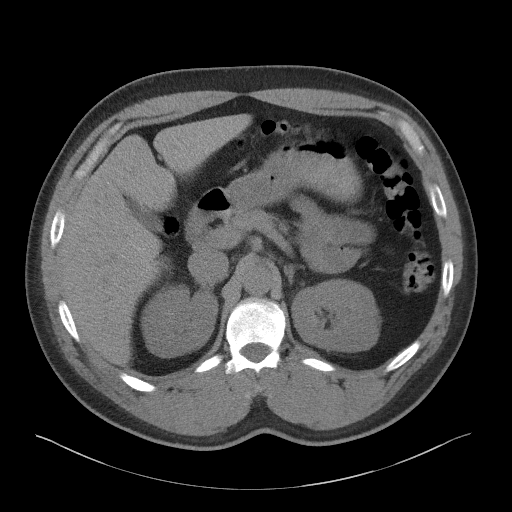
[im 80/101  soft-tissue]
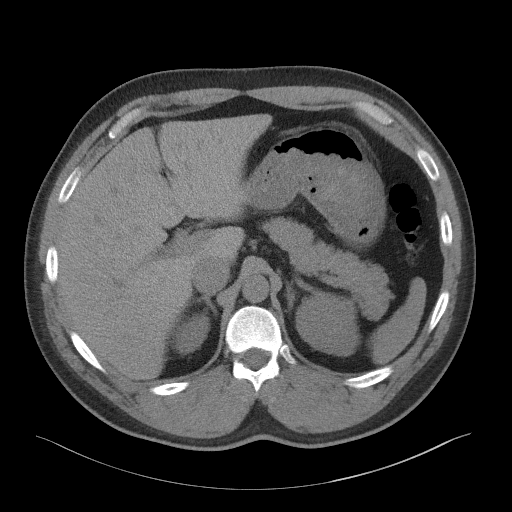
[im 88/101  soft-tissue]
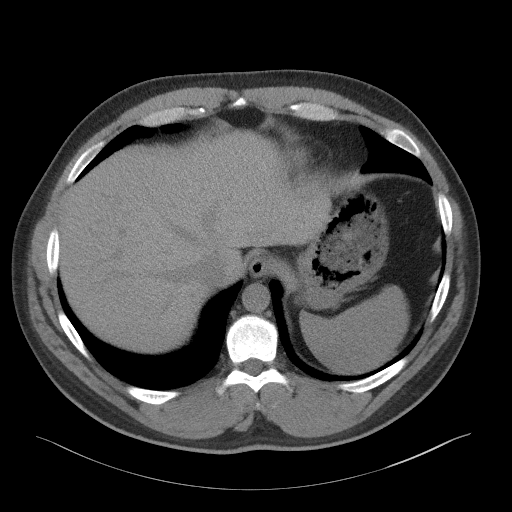
[im 96/101  soft-tissue]
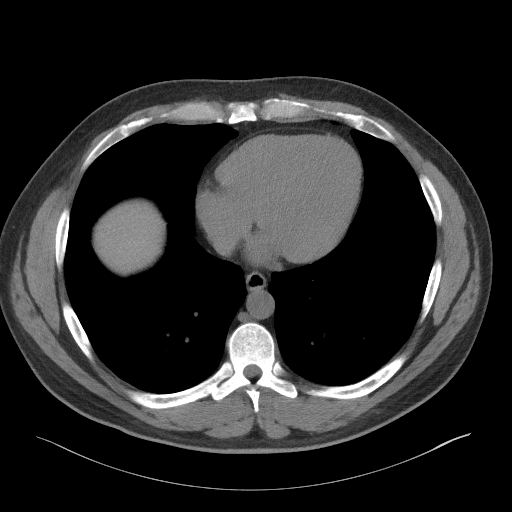

[Series 4: coronal st · coronal · 0.89mm/px · 3 of 114 slices shown]
[im 38/114  soft-tissue]
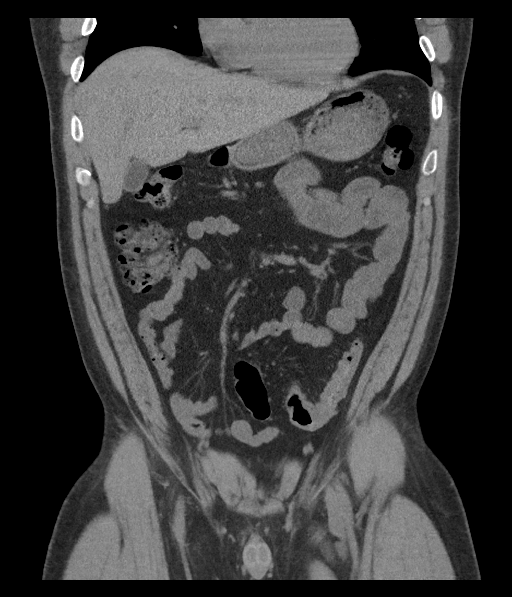
[im 51/114  soft-tissue]
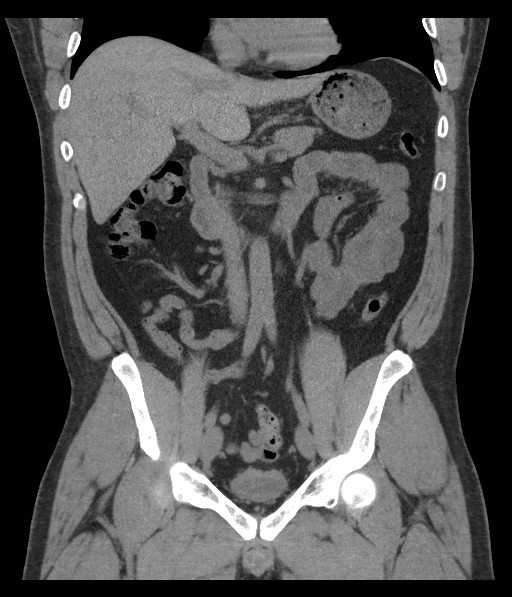
[im 63/114  soft-tissue]
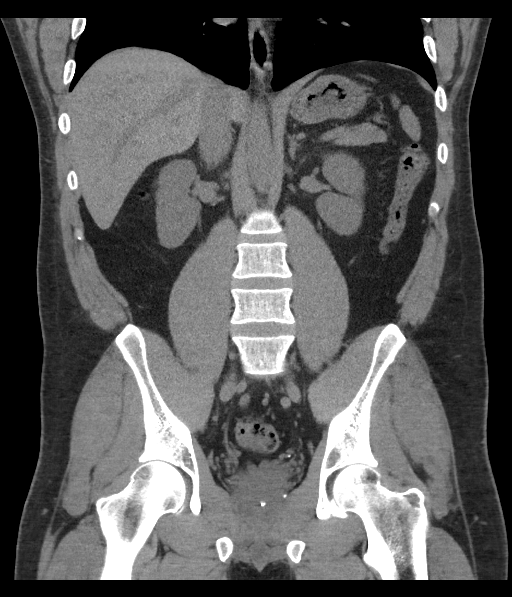

[17 of 46 positions shown; findings below may reference images not displayed]

FINDINGS: Lower chest: Lung bases are clear.

Hepatobiliary: No focal liver abnormality is seen. No gallstones,
gallbladder wall thickening, or biliary dilatation.

Pancreas: Unremarkable. No pancreatic ductal dilatation or
surrounding inflammatory changes.

Spleen: Normal in size without focal abnormality.

Adrenals/Urinary Tract: Adrenal glands are unremarkable. Kidneys are
normal, without renal calculi, focal lesion, or hydronephrosis.
Bladder is decompressed.

Stomach/Bowel: Stomach is within normal limits. Appendix appears
normal. No evidence of bowel wall thickening, distention, or
inflammatory changes.

Vascular/Lymphatic: No significant vascular findings are present. No
enlarged abdominal or pelvic lymph nodes.

Reproductive: Prostate is unremarkable.

Other: No abdominal wall hernia or abnormality. No abdominopelvic
ascites.

Musculoskeletal: No acute or significant osseous findings.
IMPRESSION: No renal or ureteral stone or obstruction. No acute process
demonstrated on noncontrast imaging of the abdomen and pelvis.

## 2020-02-12 ENCOUNTER — Ambulatory Visit
Admission: EM | Admit: 2020-02-12 | Discharge: 2020-02-12 | Disposition: A | Discharge status: Home / Self Care | Payer: 59 | Attending: Family Medicine | Admitting: Family Medicine

## 2020-02-12 ENCOUNTER — Ambulatory Visit (INDEPENDENT_AMBULATORY_CARE_PROVIDER_SITE_OTHER): Payer: 59

## 2020-02-12 ENCOUNTER — Ambulatory Visit
Admission: EM | Admit: 2020-02-12 | Discharge: 2020-02-12 | Disposition: A | Discharge status: Home / Self Care | Payer: 59

## 2020-02-12 DIAGNOSIS — R0602 Shortness of breath: Secondary | ICD-10-CM | POA: Diagnosis not present

## 2020-02-12 DIAGNOSIS — R059 Cough, unspecified: Secondary | ICD-10-CM | POA: Diagnosis not present

## 2020-02-12 DIAGNOSIS — Z1152 Encounter for screening for COVID-19: Secondary | ICD-10-CM

## 2020-02-12 MED ORDER — PREDNISONE 10 MG PO TABS: 40.0000 mg | ORAL_TABLET | Freq: Every day | ORAL | 0 refills | Status: AC

## 2020-02-12 MED ORDER — ALBUTEROL SULFATE HFA 108 (90 BASE) MCG/ACT IN AERS: 1.0000 | INHALATION_SPRAY | Freq: Four times a day (QID) | RESPIRATORY_TRACT | 0 refills | Status: DC | PRN

## 2020-02-12 NOTE — ED Provider Notes (Signed)
Ryan Casey    CSN: [id number] Arrival date & time: 02/12/20  1101      History   Chief Complaint Chief Complaint  Patient presents with  . Nasal Congestion  . Shortness of Breath    HPI Ryan Casey is a 40 y.o. male.   Patient is a 40 year old male that presents today with nasal congestion, cough, mild shortness of breath.  Describes some tightness in the chest with breathing and shortness of breath from time to time.  Did use a nebulizer treatment last night which seemed to help.  Mild production of sputum.  No fevers, chills, body aches or night sweats.     Past Medical History:  Diagnosis Date  . Hypertension   . Kidney stone     Patient Active Problem List   Diagnosis Date Noted  . 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 Essential hypertension 04/26/2018    Past Surgical History:  Procedure Laterality Date  . URETERAL STENT PLACEMENT    . VASECTOMY    . WISDOM TOOTH EXTRACTION         Home Medications    Prior to Admission medications   Medication Sig Start Date End Date Taking? Authorizing Provider  albuterol (VENTOLIN HFA) 108 (90 Base) MCG/ACT inhaler Inhale 1-2 puffs into the lungs every 6 (six) hours as needed for wheezing or shortness of breath. 02/12/20  Yes Moriah Loughry A, NP  predniSONE (DELTASONE) 10 MG tablet Take 4 tablets (40 mg total) by mouth daily for 5 days. 02/12/20 02/17/20 Yes Marcelina Mclaurin A, NP  FLAXSEED, LINSEED, PO Take by mouth.    [provider]  lisinopril (ZESTRIL) 20 MG tablet Take 1 tablet (20 mg total) by mouth daily. 08/20/18   Wendling, Jilda Roche, DO  OVER THE COUNTER MEDICATION Take 1 mL by mouth daily. Blackseed Oil    [provider]    Family History Family History  Problem Relation Age of Onset  . Heart disease Mother   . Hyperlipidemia Father     Social History Social History   Tobacco Use  . Smoking status: Never Smoker  . Smokeless  tobacco: Former Neurosurgeon    Types: Snuff  Vaping Use  . Vaping Use: Never used  Substance Use Topics  . Alcohol use: Not Currently  . Drug use: Never     Allergies   Patient has no known allergies.   Review of Systems Review of Systems   Physical Exam Triage Vital Signs ED Triage Vitals [02/12/20 1121]  Enc Vitals Group     BP      Pulse      Resp      Temp      Temp src      SpO2      Weight      Height      Head Circumference      Peak Flow      Pain Score 0     Pain Loc      Pain Edu?      Excl. in GC?    No data found.  Updated Vital Signs BP (!) 145/89   Pulse 89   Temp 98.1 F (36.7 C)   Resp 20   SpO2 98%   Visual Acuity Right Eye Distance:   Left Eye Distance:   Bilateral Distance:    Right Eye Near:   Left Eye Near:    Bilateral Near:  Physical Exam Vitals and nursing note reviewed.  Constitutional:      Appearance: Normal appearance.  HENT:     Head: Normocephalic and atraumatic.     Nose: Nose normal.  Eyes:     Conjunctiva/sclera: Conjunctivae normal.  Pulmonary:     Effort: Pulmonary effort is normal.     Breath sounds: Examination of the right-lower field reveals wheezing. Examination of the left-lower field reveals wheezing. Wheezing present.  Abdominal:     Palpations: Abdomen is soft.     Tenderness: There is no abdominal tenderness.  Musculoskeletal:        General: Normal range of motion.     Cervical back: Normal range of motion.  Skin:    General: Skin is warm and dry.  Neurological:     Mental Status: He is alert.  Psychiatric:        Mood and Affect: Mood normal.      UC Treatments / Results  Labs (all labs ordered are listed, but only abnormal results are displayed) Labs Reviewed  NOVEL CORONAVIRUS, NAA    EKG   Radiology DG Chest 2 View  Result Date: 02/12/2020 CLINICAL DATA:  Shortness of breath EXAM: CHEST - 2 VIEW COMPARISON:  None. FINDINGS: The heart size and mediastinal contours are within  normal limits. Both lungs are clear. The visualized skeletal structures are unremarkable. IMPRESSION: No active cardiopulmonary disease. Electronically Signed   By: Alcide Clever M.D.   On: 02/12/2020 12:07    Procedures Procedures (including critical care time)  Medications Ordered in UC Medications - No data to display  Initial Impression / Assessment and Plan / UC Course  I have reviewed the triage vital signs and the nursing notes.  Pertinent labs & imaging results that were available during my care of the patient were reviewed by me and considered in my medical decision making (see chart for details).     Cough, SOB X-ray without any acute findings Most likely mild bronchitis based on wheezing, SOB.  No acute concerns.  Prescribing prednisone x 5 days.  Albuterol inhaler as needed.  Follow up as needed for continued or worsening symptoms  Final Clinical Impressions(s) / UC Diagnoses   Final diagnoses:  Cough  SOB (shortness of breath)     Discharge Instructions     Nothing concerning on your exam. I will prescribe you inhaler to use as needed.  We will do a short course of steroids for your lungs Follow up as needed for continued or worsening symptoms     ED Prescriptions    Medication Sig Dispense Auth. Provider   predniSONE (DELTASONE) 10 MG tablet Take 4 tablets (40 mg total) by mouth daily for 5 days. 20 tablet Ada Woodbury A, NP   albuterol (VENTOLIN HFA) 108 (90 Base) MCG/ACT inhaler Inhale 1-2 puffs into the lungs every 6 (six) hours as needed for wheezing or shortness of breath. 1 each Janace Aris, NP     PDMP not reviewed this encounter.   Janace Aris, NP 02/12/20 1221

## 2020-02-12 NOTE — Discharge Instructions (Signed)
Nothing concerning on your exam. I will prescribe you inhaler to use as needed.  We will do a short course of steroids for your lungs Follow up as needed for continued or worsening symptoms

## 2020-02-12 NOTE — ED Triage Notes (Signed)
Pt presents with nasal congestion for past week, recently developed some cough and chest tightness. Had leftover z pack at home that he began lat night

## 2020-02-14 LAB — NOVEL CORONAVIRUS, NAA: SARS-CoV-2, NAA: DETECTED — AB

## 2020-02-14 LAB — SARS-COV-2, NAA 2 DAY TAT

## 2020-02-23 ENCOUNTER — Encounter: Payer: 59 | Admitting: Family Medicine

## 2020-03-03 ENCOUNTER — Encounter: Payer: Self-pay | Admitting: Family Medicine

## 2020-03-03 ENCOUNTER — Other Ambulatory Visit: Payer: Self-pay

## 2020-03-03 ENCOUNTER — Ambulatory Visit (INDEPENDENT_AMBULATORY_CARE_PROVIDER_SITE_OTHER): Payer: 59 | Admitting: Family Medicine

## 2020-03-03 VITALS — BP 132/88 | HR 87 | Temp 98.0°F | Ht 75.0 in | Wt 296.2 lb

## 2020-03-03 DIAGNOSIS — Z1159 Encounter for screening for other viral diseases: Secondary | ICD-10-CM

## 2020-03-03 DIAGNOSIS — Z114 Encounter for screening for human immunodeficiency virus [HIV]: Secondary | ICD-10-CM

## 2020-03-03 DIAGNOSIS — E785 Hyperlipidemia, unspecified: Secondary | ICD-10-CM

## 2020-03-03 DIAGNOSIS — Z0000 Encounter for general adult medical examination without abnormal findings: Secondary | ICD-10-CM

## 2020-03-03 DIAGNOSIS — Z23 Encounter for immunization: Secondary | ICD-10-CM

## 2020-03-03 DIAGNOSIS — B360 Pityriasis versicolor: Secondary | ICD-10-CM | POA: Diagnosis not present

## 2020-03-03 MED ORDER — FLUCONAZOLE 150 MG PO TABS: ORAL_TABLET | ORAL | 0 refills | Status: DC

## 2020-03-03 MED ORDER — KETOCONAZOLE 2 % EX CREA: TOPICAL_CREAM | CUTANEOUS | 0 refills | Status: DC

## 2020-03-03 NOTE — Addendum Note (Signed)
Addended by: Scharlene Gloss B on: 03/03/2020 03:25 PM   Modules accepted: Orders

## 2020-03-03 NOTE — Patient Instructions (Addendum)
Give [place] 2-3 business days to get the results of your labs back.   Keep the diet clean and stay active.  Wear your seat belt.   Use the Diflucan prior to the cream. Take it, wait 1 hour and then work up a sweat. Leave it on for 8 hrs.   Consider Pepcid/famotidine 20 mg up to twice daily for reflux symptoms.   Heat (pad or rice pillow in microwave) over affected area, 10-15 minutes twice daily.   OK to take Tylenol 1000 mg (2 extra strength tabs) or 975 mg (3 regular strength tabs) every 6 hours as needed.  Let us know if you need anything.  Trapezius stretches/exercises Do exercises exactly as told by your health care provider and adjust them as directed. It is normal to feel mild stretching, pulling, tightness, or discomfort as you do these exercises, but you should stop right away if you feel sudden pain or your pain gets worse.  Stretching and range of motion exercises These exercises warm up your muscles and joints and improve the movement and flexibility of your shoulder. These exercises can also help to relieve pain, numbness, and tingling. If you are unable to do any of the following for any reason, do not further attempt to do it.   Exercise A: Flexion, standing    1. Stand and hold a broomstick, a cane, or a similar object. Place your hands a little more than shoulder-width apart on the object. Your left / right hand should be palm-up, and your other hand should be palm-down. 2. Push the stick to raise your left / right arm out to your side and then over your head. Use your other hand to help move the stick. Stop when you feel a stretch in your shoulder, or when you reach the angle that is recommended by your health care provider. ? Avoid shrugging your shoulder while you raise your arm. Keep your shoulder blade tucked down toward your spine. 3. Hold for 30 seconds. 4. Slowly return to the starting position. Repeat 2 times. Complete this exercise 3 times per week.  Exercise  B: Abduction, supine    1. Lie on your back and hold a broomstick, a cane, or a similar object. Place your hands a little more than shoulder-width apart on the object. Your left / right hand should be palm-up, and your other hand should be palm-down. 2. Push the stick to raise your left / right arm out to your side and then over your head. Use your other hand to help move the stick. Stop when you feel a stretch in your shoulder, or when you reach the angle that is recommended by your health care provider. ? Avoid shrugging your shoulder while you raise your arm. Keep your shoulder blade tucked down toward your spine. 3. Hold for 30 seconds. 4. Slowly return to the starting position. Repeat 2 times. Complete this exercise 3 times per week.  Exercise C: Flexion, active-assisted    1. Lie on your back. You may bend your knees for comfort. 2. Hold a broomstick, a cane, or a similar object. Place your hands about shoulder-width apart on the object. Your palms should face toward your feet. 3. Raise the stick and move your arms over your head and behind your head, toward the floor. Use your healthy arm to help your left / right arm move farther. Stop when you feel a gentle stretch in your shoulder, or when you reach the angle where your health  care provider tells you to stop. 4. Hold for 30 seconds. 5. Slowly return to the starting position. Repeat 2 times. Complete this exercise 3 times per week.  Exercise D: External rotation and abduction    1. Stand in a door frame with one of your feet slightly in front of the other. This is called a staggered stance. 2. Choose one of the following positions as told by your health care provider: ? Place your hands and forearms on the door frame above your head. ? Place your hands and forearms on the door frame at the height of your head. ? Place your hands on the door frame at the height of your elbows. 3. Slowly move your weight onto your front foot  until you feel a stretch across your chest and in the front of your shoulders. Keep your head and chest upright and keep your abdominal muscles tight. 4. Hold for 30 seconds. 5. To release the stretch, shift your weight to your back foot. Repeat 2 times. Complete this stretch 3 times per week.  Strengthening exercises These exercises build strength and endurance in your shoulder. Endurance is the ability to use your muscles for a long time, even after your muscles get tired. Exercise E: Scapular depression and adduction  1. Sit on a stable chair. Support your arms in front of you with pillows, armrests, or a tabletop. Keep your elbows in line with the sides of your body. 2. Gently move your shoulder blades down toward your middle back. Relax the muscles on the tops of your shoulders and in the back of your neck. 3. Hold for 3 seconds. 4. Slowly release the tension and relax your muscles completely before doing this exercise again. Repeat for a total of 10 repetitions. 5. After you have practiced this exercise, try doing the exercise without the arm support. Then, try the exercise while standing instead of sitting. Repeat 2 times. Complete this exercise 3 times per week.  Exercise F: Shoulder abduction, isometric    1. Stand or sit about 4-6 inches (10-15 cm) from a wall with your left / right side facing the wall. 2. Bend your left / right elbow and gently press your elbow against the wall. 3. Increase the pressure slowly until you are pressing as hard as you can without shrugging your shoulder. 4. Hold for 3 seconds. 5. Slowly release the tension and relax your muscles completely. Repeat for a total of 10 repetitions. Repeat 2 times. Complete this exercise 3 times per week.  Exercise G: Shoulder flexion, isometric    1. Stand or sit about 4-6 inches (10-15 cm) away from a wall with your left / right side facing the wall. 2. Keep your left / right elbow straight and gently press the  top of your fist against the wall. Increase the pressure slowly until you are pressing as hard as you can without shrugging your shoulder. 3. Hold for 10-15 seconds. 4. Slowly release the tension and relax your muscles completely. Repeat for a total of 10 repetitions. Repeat 2 times. Complete this exercise 3 times per week.  Exercise H: Internal rotation    1. Sit in a stable chair without armrests, or stand. Secure an exercise band at your left / right side, at elbow height. 2. Place a soft object, such as a folded towel or a small pillow, under your left / right upper arm so your elbow is a few inches (about 8 cm) away from your side. 3. Hold  the end of the exercise band so the band stretches. 4. Keeping your elbow pressed against the soft object under your arm, move your forearm across your body toward your abdomen. Keep your body steady so the movement is only coming from your shoulder. 5. Hold for 3 seconds. 6. Slowly return to the starting position. Repeat for a total of 10 repetitions. Repeat 2 times. Complete this exercise 3 times per week.  Exercise I: External rotation    1. Sit in a stable chair without armrests, or stand. 2. Secure an exercise band at your left / right side, at elbow height. 3. Place a soft object, such as a folded towel or a small pillow, under your left / right upper arm so your elbow is a few inches (about 8 cm) away from your side. 4. Hold the end of the exercise band so the band stretches. 5. Keeping your elbow pressed against the soft object under your arm, move your forearm out, away from your abdomen. Keep your body steady so the movement is only coming from your shoulder. 6. Hold for 3 seconds. 7. Slowly return to the starting position. Repeat for a total of 10 repetitions. Repeat 2 times. Complete this exercise 3 times per week. Exercise J: Shoulder extension  1. Sit in a stable chair without armrests, or stand. Secure an exercise band to a stable  object in front of you so the band is at shoulder height. 2. Hold one end of the exercise band in each hand. Your palms should face each other. 3. Straighten your elbows and lift your hands up to shoulder height. 4. Step back, away from the secured end of the exercise band, until the band stretches. 5. Squeeze your shoulder blades together and pull your hands down to the sides of your thighs. Stop when your hands are straight down by your sides. Do not let your hands go behind your body. 6. Hold for 3 seconds. 7. Slowly return to the starting position. Repeat for a total of 10 repetitions. Repeat 2 times. Complete this exercise 3 times per week.  Exercise K: Shoulder extension, prone    1. Lie on your abdomen on a firm surface so your left / right arm hangs over the edge. 2. Hold a 5 lb weight in your hand so your palm faces in toward your body. Your arm should be straight. 3. Squeeze your shoulder blade down toward the middle of your back. 4. Slowly raise your arm behind you, up to the height of the surface that you are lying on. Keep your arm straight. 5. Hold for 3 seconds. 6. Slowly return to the starting position and relax your muscles. Repeat for a total of 10 repetitions. Repeat 2 times. Complete this exercise 3 times per week.   Exercise L: Horizontal abduction, prone  1. Lie on your abdomen on a firm surface so your left / right arm hangs over the edge. 2. Hold a 5 lb weight in your hand so your palm faces toward your feet. Your arm should be straight. 3. Squeeze your shoulder blade down toward the middle of your back. 4. Bend your elbow so your hand moves up, until your elbow is bent to an "L" shape (90 degrees). With your elbow bent, slowly move your forearm forward and up. Raise your hand up to the height of the surface that you are lying on. ? Your upper arm should not move, and your elbow should stay bent. ? At the top  of the movement, your palm should face the  floor. 5. Hold for 3 seconds. 6. Slowly return to the starting position and relax your muscles. Repeat for a total of 10 repetitions. Repeat 2 times. Complete this exercise 3 times per week.  Exercise M: Horizontal abduction, standing  1. Sit on a stable chair, or stand. 2. Secure an exercise band to a stable object in front of you so the band is at shoulder height. 3. Hold one end of the exercise band in each hand. 4. Straighten your elbows and lift your hands straight in front of you, up to shoulder height. Your palms should face down, toward the floor. 5. Step back, away from the secured end of the exercise band, until the band stretches. 6. Move your arms out to your sides, and keep your arms straight. 7. Hold for 3 seconds. 8. Slowly return to the starting position. Repeat for a total of 10 repetitions. Repeat 2 times. Complete this exercise 3 times per week.  Exercise N: Scapular retraction and elevation  1. Sit on a stable chair, or stand. 2. Secure an exercise band to a stable object in front of you so the band is at shoulder height. 3. Hold one end of the exercise band in each hand. Your palms should face each other. 4. Sit in a stable chair without armrests, or stand. 5. Step back, away from the secured end of the exercise band, until the band stretches. 6. Squeeze your shoulder blades together and lift your hands over your head. Keep your elbows straight. 7. Hold for 3 seconds. 8. Slowly return to the starting position. Repeat for a total of 10 repetitions. Repeat 2 times. Complete this exercise 3 times per week.  This information is not intended to replace advice given to you by your health care provider. Make sure you discuss any questions you have with your health care provider. Document Released: 01/23/2005 Document Revised: 09/30/2015 Document Reviewed: 12/10/2014 Elsevier Interactive Patient Education  2017 [employer].

## 2020-03-03 NOTE — Progress Notes (Signed)
Chief Complaint  Patient presents with  . Annual Exam    [name] is here for a complete physical.   His last physical was >1 year ago.  Current diet: in general, diet is not healthy.   Current exercise: body wt exercise, some walking Weight trend: Gained some Fatigue out of ordinary? No. Seat belt? Yes.    Health maintenance Tetanus- No HIV- No Hep C- No  Past Medical History:  Diagnosis Date  . Hypertension   . Kidney stone      Past Surgical History:  Procedure Laterality Date  . URETERAL STENT PLACEMENT    . VASECTOMY    . WISDOM TOOTH EXTRACTION      Medications  Current Outpatient Medications on File Prior to Visit  Medication Sig Dispense Refill  . FLAXSEED, LINSEED, PO Take by mouth.    Marland Kitchen OVER THE COUNTER MEDICATION Take 1 mL by mouth daily. Blackseed Oil    . lisinopril (ZESTRIL) 20 MG tablet Take 1 tablet (20 mg total) by mouth daily. (Patient not taking: Reported on 03/03/2020) 30 tablet 3    Allergies No Known Allergies  Family History Family History  Problem Relation Age of Onset  . Heart disease Mother   . Hyperlipidemia Father     Review of Systems: Constitutional: no fevers or chills Eye:  no recent significant change in vision Ear/Nose/Mouth/Throat:  Ears:  no hearing loss Nose/Mouth/Throat:  no complaints of nasal congestion, no sore throat Cardiovascular:  no chest pain Respiratory:  no shortness of breath Gastrointestinal: +reflux, no bowel changes GU:  Male: negative for dysuria, frequency, and incontinence Musculoskeletal/Extremities:  no pain of the joints Integumentary (Skin/Breast): +pale patches of skin; +warts/scars on hand Neurologic:  no headaches Endocrine: No unexpected weight changes Hematologic/Lymphatic:  no night sweats  Exam BP 132/88 (BP Location: Left Arm, Patient Position: Sitting, Cuff Size: Large)   Pulse 87   Temp 98 F (36.7 C) (Oral)   Ht 6\' 3"  (1.905 m)   Wt 296 lb 4 oz (134.4 kg)   SpO2  97%   BMI 37.03 kg/m  General:  well developed, well nourished, in no apparent distress Skin:  Raised lesions on dorsum of R hand and on dorsum of PIP on 3rd digit R hand; hypopigmented macules that will sometimes have confluens on UE's, axillary region and upper torso Head:  no masses, lesions, or tenderness Eyes:  pupils equal and round, sclera anicteric without injection Ears:  canals without lesions, TMs shiny without retraction, no obvious effusion, no erythema Nose:  nares patent, septum midline, mucosa normal Throat/Pharynx:  lips and gingiva without lesion; tongue and uvula midline; non-inflamed pharynx; no exudates or postnasal drainage Neck: neck supple without adenopathy, thyromegaly, or masses Lungs:  clear to auscultation, breath sounds equal bilaterally, no respiratory distress Cardio:  regular rate and rhythm, no bruits, no LE edema Abdomen:  abdomen soft, nontender; bowel sounds normal; no masses or organomegaly Rectal: Deferred Musculoskeletal:  symmetrical muscle groups noted without atrophy or deformity Extremities:  no clubbing, cyanosis, or edema, no deformities, no skin discoloration Neuro:  gait normal; deep tendon reflexes normal and symmetric Psych: well oriented with normal range of affect and appropriate judgment/insight  Assessment and Plan  Well adult exam - Plan: Comprehensive metabolic panel, Lipid panel  Tinea versicolor - Plan: fluconazole (DIFLUCAN) 150 MG tablet, ketoconazole (NIZORAL) 2 % cream  Screening for HIV (human immunodeficiency virus) - Plan: HIV Antibody (routine testing w rflx)  Encounter for hepatitis C  screening test for low risk patient - Plan: Hepatitis C antibody   Well 40 y.o. male. Counseled on diet and exercise. Counseled on risks and benefits of prostate cancer screening with PSA. The patient agrees to forego screening.  Aspirin paste daily for warts, if no improvement will shave, could be Keloid.  Diflucan 1 hr before  sweating, do not shower for 8 hrs. Ketoconazole daily following this.  Pepcid for reflux prn.  Tdap today. Other orders as above. Follow up in 1 yr pending the above workup. The patient voiced understanding and agreement to the plan.  Jilda Roche [place], DO 03/03/20 3:20 PM

## 2020-03-04 LAB — COMPREHENSIVE METABOLIC PANEL
ALT: 19 U/L (ref 0–53)
AST: 16 U/L (ref 0–37)
Albumin: 4.5 g/dL (ref 3.5–5.2)
Alkaline Phosphatase: 57 U/L (ref 39–117)
BUN: 12 mg/dL (ref 6–23)
CO2: 28 mEq/L (ref 19–32)
Calcium: 9.7 mg/dL (ref 8.4–10.5)
Chloride: 104 mEq/L (ref 96–112)
Creatinine, Ser: 1.09 mg/dL (ref 0.40–1.50)
GFR: 84.81 mL/min (ref >60.00)
Glucose, Bld: 94 mg/dL (ref 70–99)
Potassium: 4.6 mEq/L (ref 3.5–5.1)
Sodium: 139 mEq/L (ref 135–145)
Total Bilirubin: 0.6 mg/dL (ref 0.2–1.2)
Total Protein: 7.1 g/dL (ref 6.0–8.3)

## 2020-03-04 LAB — LIPID PANEL
Cholesterol: 235 mg/dL — ABNORMAL HIGH (ref 0–200)
HDL: 40.5 mg/dL (ref >39.00)
LDL Cholesterol: 171 mg/dL — ABNORMAL HIGH (ref 0–99)
NonHDL: 194.07
Total CHOL/HDL Ratio: 6
Triglycerides: 117 mg/dL (ref 0.0–149.0)
VLDL: 23.4 mg/dL (ref 0.0–40.0)

## 2020-03-04 LAB — HEPATITIS C ANTIBODY
Hepatitis C Ab: NONREACTIVE
SIGNAL TO CUT-OFF: 0.01 (ref <1.00)

## 2020-03-04 LAB — HIV ANTIBODY (ROUTINE TESTING W REFLEX): HIV 1&2 Ab, 4th Generation: NONREACTIVE

## 2020-03-04 NOTE — Addendum Note (Signed)
Addended byConrad [place] D on: 03/04/2020 12:53 PM   Modules accepted: Orders

## 2022-01-23 ENCOUNTER — Encounter: Payer: Self-pay | Admitting: Family Medicine

## 2022-01-23 ENCOUNTER — Ambulatory Visit (INDEPENDENT_AMBULATORY_CARE_PROVIDER_SITE_OTHER): Payer: 59 | Admitting: Family Medicine

## 2022-01-23 VITALS — BP 128/88 | HR 71 | Temp 98.3°F | Ht 75.0 in | Wt 270.1 lb

## 2022-01-23 DIAGNOSIS — S46819A Strain of other muscles, fascia and tendons at shoulder and upper arm level, unspecified arm, initial encounter: Secondary | ICD-10-CM | POA: Diagnosis not present

## 2022-01-23 DIAGNOSIS — R0789 Other chest pain: Secondary | ICD-10-CM | POA: Diagnosis not present

## 2022-01-23 DIAGNOSIS — N50812 Left testicular pain: Secondary | ICD-10-CM

## 2022-01-23 DIAGNOSIS — G252 Other specified forms of tremor: Secondary | ICD-10-CM

## 2022-01-23 DIAGNOSIS — Z0000 Encounter for general adult medical examination without abnormal findings: Secondary | ICD-10-CM

## 2022-01-23 DIAGNOSIS — Z0001 Encounter for general adult medical examination with abnormal findings: Secondary | ICD-10-CM

## 2022-01-23 LAB — COMPREHENSIVE METABOLIC PANEL
ALT: 13 U/L (ref 0–53)
AST: 17 U/L (ref 0–37)
Albumin: 4.5 g/dL (ref 3.5–5.2)
Alkaline Phosphatase: 53 U/L (ref 39–117)
BUN: 13 mg/dL (ref 6–23)
CO2: 27 mEq/L (ref 19–32)
Calcium: 9.7 mg/dL (ref 8.4–10.5)
Chloride: 103 mEq/L (ref 96–112)
Creatinine, Ser: 1.1 mg/dL (ref 0.40–1.50)
GFR: 82.78 mL/min (ref >60.00)
Glucose, Bld: 86 mg/dL (ref 70–99)
Potassium: 4.3 mEq/L (ref 3.5–5.1)
Sodium: 138 mEq/L (ref 135–145)
Total Bilirubin: 0.6 mg/dL (ref 0.2–1.2)
Total Protein: 7 g/dL (ref 6.0–8.3)

## 2022-01-23 LAB — LIPID PANEL
Cholesterol: 214 mg/dL — ABNORMAL HIGH (ref 0–200)
HDL: 53.9 mg/dL (ref >39.00)
LDL Cholesterol: 138 mg/dL — ABNORMAL HIGH (ref 0–99)
NonHDL: 160.22
Total CHOL/HDL Ratio: 4
Triglycerides: 113 mg/dL (ref 0.0–149.0)
VLDL: 22.6 mg/dL (ref 0.0–40.0)

## 2022-01-23 LAB — CBC
HCT: 47.9 % (ref 39.0–52.0)
Hemoglobin: 15.8 g/dL (ref 13.0–17.0)
MCHC: 32.9 g/dL (ref 30.0–36.0)
MCV: 83.8 fl (ref 78.0–100.0)
Platelets: 289 10*3/uL (ref 150.0–400.0)
RBC: 5.72 Mil/uL (ref 4.22–5.81)
RDW: 14.7 % (ref 11.5–15.5)
WBC: 9.2 10*3/uL (ref 4.0–10.5)

## 2022-01-23 NOTE — Patient Instructions (Signed)
Give Ryan Casey 2-3 business days to get the results of your labs back.   Heat (pad or rice pillow in microwave) over affected area, 10-15 minutes twice daily.   Keep the diet clean and stay active.  Let Ryan Casey know if you need anything.  Trapezius stretches/exercises Do exercises exactly as told by your health care provider and adjust them as directed. It is normal to feel mild stretching, pulling, tightness, or discomfort as you do these exercises, but you should stop right away if you feel sudden pain or your pain gets worse.   Stretching and range of motion exercises These exercises warm up your muscles and joints and improve the movement and flexibility of your shoulder. These exercises can also help to relieve pain, numbness, and tingling. If you are unable to do any of the following for any reason, do not further attempt to do it.   Exercise A: Flexion, standing     Stand and hold a broomstick, a cane, or a similar object. Place your hands a little more than shoulder-width apart on the object. Your left / right hand should be palm-up, and your other hand should be palm-down. Push the stick to raise your left / right arm out to your side and then over your head. Use your other hand to help move the stick. Stop when you feel a stretch in your shoulder, or when you reach the angle that is recommended by your health care provider. Avoid shrugging your shoulder while you raise your arm. Keep your shoulder blade tucked down toward your spine. Hold for 30 seconds. Slowly return to the starting position. Repeat 2 times. Complete this exercise 3 times per week.  Exercise B: Abduction, supine     Lie on your back and hold a broomstick, a cane, or a similar object. Place your hands a little more than shoulder-width apart on the object. Your left / right hand should be palm-up, and your other hand should be palm-down. Push the stick to raise your left / right arm out to your side and then over your head.  Use your other hand to help move the stick. Stop when you feel a stretch in your shoulder, or when you reach the angle that is recommended by your health care provider. Avoid shrugging your shoulder while you raise your arm. Keep your shoulder blade tucked down toward your spine. Hold for 30 seconds. Slowly return to the starting position. Repeat 2 times. Complete this exercise 3 times per week.  Exercise C: Flexion, active-assisted     Lie on your back. You may bend your knees for comfort. Hold a broomstick, a cane, or a similar object. Place your hands about shoulder-width apart on the object. Your palms should face toward your feet. Raise the stick and move your arms over your head and behind your head, toward the floor. Use your healthy arm to help your left / right arm move farther. Stop when you feel a gentle stretch in your shoulder, or when you reach the angle where your health care provider tells you to stop. Hold for 30 seconds. Slowly return to the starting position. Repeat 2 times. Complete this exercise 3 times per week.  Exercise D: External rotation and abduction     Stand in a door frame with one of your feet slightly in front of the other. This is called a staggered stance. Choose one of the following positions as told by your health care provider: Place your hands and forearms  on the door frame above your head. Place your hands and forearms on the door frame at the height of your head. Place your hands on the door frame at the height of your elbows. Slowly move your weight onto your front foot until you feel a stretch across your chest and in the front of your shoulders. Keep your head and chest upright and keep your abdominal muscles tight. Hold for 30 seconds. To release the stretch, shift your weight to your back foot. Repeat 2 times. Complete this stretch 3 times per week.  Strengthening exercises These exercises build strength and endurance in your shoulder.  Endurance is the ability to use your muscles for a long time, even after your muscles get tired. Exercise E: Scapular depression and adduction  Sit on a stable chair. Support your arms in front of you with pillows, armrests, or a tabletop. Keep your elbows in line with the sides of your body. Gently move your shoulder blades down toward your middle back. Relax the muscles on the tops of your shoulders and in the back of your neck. Hold for 3 seconds. Slowly release the tension and relax your muscles completely before doing this exercise again. Repeat for a total of 10 repetitions. After you have practiced this exercise, try doing the exercise without the arm support. Then, try the exercise while standing instead of sitting. Repeat 2 times. Complete this exercise 3 times per week.  Exercise F: Shoulder abduction, isometric     Stand or sit about 4-6 inches (10-15 cm) from a wall with your left / right side facing the wall. Bend your left / right elbow and gently press your elbow against the wall. Increase the pressure slowly until you are pressing as hard as you can without shrugging your shoulder. Hold for 3 seconds. Slowly release the tension and relax your muscles completely. Repeat for a total of 10 repetitions. Repeat 2 times. Complete this exercise 3 times per week.  Exercise G: Shoulder flexion, isometric     Stand or sit about 4-6 inches (10-15 cm) away from a wall with your left / right side facing the wall. Keep your left / right elbow straight and gently press the top of your fist against the wall. Increase the pressure slowly until you are pressing as hard as you can without shrugging your shoulder. Hold for 10-15 seconds. Slowly release the tension and relax your muscles completely. Repeat for a total of 10 repetitions. Repeat 2 times. Complete this exercise 3 times per week.  Exercise H: Internal rotation     Sit in a stable chair without armrests, or stand. Secure an  exercise band at your left / right side, at elbow height. Place a soft object, such as a folded towel or a small pillow, under your left / right upper arm so your elbow is a few inches (about 8 cm) away from your side. Hold the end of the exercise band so the band stretches. Keeping your elbow pressed against the soft object under your arm, move your forearm across your body toward your abdomen. Keep your body steady so the movement is only coming from your shoulder. Hold for 3 seconds. Slowly return to the starting position. Repeat for a total of 10 repetitions. Repeat 2 times. Complete this exercise 3 times per week.  Exercise I: External rotation     Sit in a stable chair without armrests, or stand. Secure an exercise band at your left / right side, at elbow  height. Place a soft object, such as a folded towel or a small pillow, under your left / right upper arm so your elbow is a few inches (about 8 cm) away from your side. Hold the end of the exercise band so the band stretches. Keeping your elbow pressed against the soft object under your arm, move your forearm out, away from your abdomen. Keep your body steady so the movement is only coming from your shoulder. Hold for 3 seconds. Slowly return to the starting position. Repeat for a total of 10 repetitions. Repeat 2 times. Complete this exercise 3 times per week. Exercise J: Shoulder extension  Sit in a stable chair without armrests, or stand. Secure an exercise band to a stable object in front of you so the band is at shoulder height. Hold one end of the exercise band in each hand. Your palms should face each other. Straighten your elbows and lift your hands up to shoulder height. Step back, away from the secured end of the exercise band, until the band stretches. Squeeze your shoulder blades together and pull your hands down to the sides of your thighs. Stop when your hands are straight down by your sides. Do not let your hands go  behind your body. Hold for 3 seconds. Slowly return to the starting position. Repeat for a total of 10 repetitions. Repeat 2 times. Complete this exercise 3 times per week.  Exercise K: Shoulder extension, prone     Lie on your abdomen on a firm surface so your left / right arm hangs over the edge. Hold a 5 lb weight in your hand so your palm faces in toward your body. Your arm should be straight. Squeeze your shoulder blade down toward the middle of your back. Slowly raise your arm behind you, up to the height of the surface that you are lying on. Keep your arm straight. Hold for 3 seconds. Slowly return to the starting position and relax your muscles. Repeat for a total of 10 repetitions. Repeat 2 times. Complete this exercise 3 times per week.   Exercise L: Horizontal abduction, prone  Lie on your abdomen on a firm surface so your left / right arm hangs over the edge. Hold a 5 lb weight in your hand so your palm faces toward your feet. Your arm should be straight. Squeeze your shoulder blade down toward the middle of your back. Bend your elbow so your hand moves up, until your elbow is bent to an "L" shape (90 degrees). With your elbow bent, slowly move your forearm forward and up. Raise your hand up to the height of the surface that you are lying on. Your upper arm should not move, and your elbow should stay bent. At the top of the movement, your palm should face the floor. Hold for 3 seconds. Slowly return to the starting position and relax your muscles. Repeat for a total of 10 repetitions. Repeat 2 times. Complete this exercise 3 times per week.  Exercise M: Horizontal abduction, standing  Sit on a stable chair, or stand. Secure an exercise band to a stable object in front of you so the band is at shoulder height. Hold one end of the exercise band in each hand. Straighten your elbows and lift your hands straight in front of you, up to shoulder height. Your palms should face  down, toward the floor. Step back, away from the secured end of the exercise band, until the band stretches. Move your arms out  to your sides, and keep your arms straight. Hold for 3 seconds. Slowly return to the starting position. Repeat for a total of 10 repetitions. Repeat 2 times. Complete this exercise 3 times per week.  Exercise N: Scapular retraction and elevation  Sit on a stable chair, or stand. Secure an exercise band to a stable object in front of you so the band is at shoulder height. Hold one end of the exercise band in each hand. Your palms should face each other. Sit in a stable chair without armrests, or stand. Step back, away from the secured end of the exercise band, until the band stretches. Squeeze your shoulder blades together and lift your hands over your head. Keep your elbows straight. Hold for 3 seconds. Slowly return to the starting position. Repeat for a total of 10 repetitions. Repeat 2 times. Complete this exercise 3 times per week.  This information is not intended to replace advice given to you by your health care provider. Make sure you discuss any questions you have with your health care provider. Document Released: 01/23/2005 Document Revised: 09/30/2015 Document Reviewed: 12/10/2014 Elsevier Interactive Patient Education  2017 [employer]  Pectoralis Major Rehab Ask your health care provider which exercises are safe for you. Do exercises exactly as told by your health care provider and adjust them as directed. It is normal to feel mild stretching, pulling, tightness, or discomfort as you do these exercises, but you should stop right away if you feel sudden pain or your pain gets worse. Do not begin these exercises until told by your health care provider. Stretching and range of motion exercises These exercises warm up your muscles and joints and improve the movement and flexibility of your shoulder. These exercises can also help to relieve pain,  numbness, and tingling. Exercise A: Pendulum  Stand near a wall or a surface that you can hold onto for balance. Bend at the waist and let your left / right arm hang straight down. Use your other arm to keep your balance. Relax your arm and shoulder muscles, and move your hips and your trunk so your left / right arm swings freely. Your arm should swing because of the motion of your body, not because you are using your arm or shoulder muscles. Keep moving so your arm swings in the following directions, as told by your health care provider: Side to side. Forward and backward. In clockwise and counterclockwise circles. Slowly return to the starting position. Repeat 2 times. Complete this exercise 3 times per week. Exercise B: Abduction, standing Stand and hold a broomstick, a cane, or a similar object. Place your hands a little more than shoulder-width apart on the object. Your left / right hand should be palm-up, and your other hand should be palm-down. While keeping your elbow straight and your shoulder muscles relaxed, push the stick across your body toward your left / right side. Raise your left / right arm to the side of your body and then over your head until you feel a stretch in your shoulder. Stop when you reach the angle that is recommended by your health care provider. Avoid shrugging your shoulder while you raise your arm. Keep your shoulder blade tucked down toward the middle of your spine. Hold for 10 seconds. Slowly return to the starting position. Repeat 2 times. Complete this exercise 3 times per week. Exercise C: Wand flexion, supine  Lie on your back. You may bend your knees for comfort. Hold a broomstick, a cane,  or a similar object so that your hands are about shoulder-width apart on the object. Your palms should face toward your feet. Raise your left / right arm in front of your face, then behind your head (toward the floor). Use your other hand to help you do this. Stop  when you feel a gentle stretch in your shoulder, or when you reach the angle that is recommended by your health care provider. Hold for 3 seconds. Use the broomstick and your other arm to help you return your left / right arm to the starting position. Repeat 2 times. Complete this exercise 3 times per week. Exercise D: Wand shoulder external rotation Stand and hold a broomstick, a cane, or a similar object so your hands are about shoulder-width apart on the object. Start with your arms hanging down, then bend both elbows to an "L" shape (90 degrees). Keep your left / right elbow at your side. Use your other hand to push the stick so your left / right forearm moves away from your body, out to your side. Keep your left / right elbow bent to 90 degrees and keep it against your side. Stop when you feel a gentle stretch in your shoulder, or when you reach the angle recommended by your health care provider. Hold for 10 seconds. Use the stick to help you return your left / right arm to the starting position. Repeat 2 times. Complete this exercise 3 times per week. Strengthening exercises These exercises build strength and endurance in your shoulder. Endurance is the ability to use your muscles for a long time, even after your muscles get tired. Exercise E: Scapular protraction, standing Stand so you are facing a wall. Place your feet about one arm-length away from the wall. Place your hands on the wall and straighten your elbows. Keep your hands on the wall as you push your upper back away from the wall. You should feel your shoulder blades sliding forward. Keep your elbows and your head still. If you are not sure that you are doing this exercise correctly, ask your health care provider for more instructions. Hold for 3 seconds. Slowly return to the starting position. Let your muscles relax completely before you repeat this exercise. Repeat 2 times. Complete this exercise 3 times per week. Exercise  F: Shoulder blade squeezes  (scapular retraction) Sit with good posture in a stable chair. Do not let your back touch the back of the chair. Your arms should be at your sides with your elbows bent. You may rest your forearms on a pillow if that is more comfortable. Squeeze your shoulder blades together. Bring them down and back. Keep your shoulders level. Do not lift your shoulders up toward your ears. Hold for 3 seconds. Return to the starting position. Repeat 2 times. Complete this exercise 3 times per week. This information is not intended to replace advice given to you by your health care provider. Make sure you discuss any questions you have with your health care provider. Document Released: 01/23/2005 Document Revised: 11/04/2015 Document Reviewed: 10/11/2014 Elsevier Interactive Patient Education  [employer].

## 2022-01-23 NOTE — Progress Notes (Signed)
Chief Complaint  Patient presents with   Annual Exam    [name] is here for a complete physical.   His last physical was >1 year ago.  Current diet: in general, a "healthy" diet.   Current exercise: walking, some wt resistance exercising Weight trend: intentionally losing Fatigue out of ordinary? No. Seat belt? Yes.   Advanced directive? No  Health maintenance Tetanus- Yes HIV- Yes Hep C- Yes  Over the past several months, the patient is experiencing tightness in his upper back area and chest.  He did have an episode of chest tightness and rapid heart rate when he was having intercourse.  Since then he has been both sexually active and physically active without any issues.  He does not stretch routinely.  He also feels that his hands are shaking when he holds them up or uses them.  He is not dropping things and denies any weakness.  Past Medical History:  Diagnosis Date   Hypertension    Kidney stone      Past Surgical History:  Procedure Laterality Date   URETERAL STENT PLACEMENT     VASECTOMY     WISDOM TOOTH EXTRACTION      Medications  Current Outpatient Medications on File Prior to Visit  Medication Sig Dispense Refill   FLAXSEED, LINSEED, PO Take by mouth.     OVER THE COUNTER MEDICATION Take 1 mL by mouth daily. Blackseed Oil     Allergies No Known Allergies  Family History Family History  Problem Relation Age of Onset   Heart disease Mother    Hyperlipidemia Father     Review of Systems: Constitutional: no fevers or chills Eye:  no recent significant change in vision Ear/Nose/Mouth/Throat:  Ears:  no hearing loss Nose/Mouth/Throat:  no complaints of nasal congestion, no sore throat Cardiovascular:  no chest pain Respiratory:  no shortness of breath Gastrointestinal:  no abdominal pain, no change in bowel habits GU:  Male: negative for dysuria, frequency, and incontinence Musculoskeletal/Extremities:  + Chest and upper back  tightness Integumentary (Skin/Breast):  no abnormal skin lesions reported Neurologic:  + Intention tremor Endocrine: No unexpected weight changes Hematologic/Lymphatic:  no night sweats  Exam BP 128/88 (BP Location: Left Arm, Patient Position: Sitting, Cuff Size: Large)   Pulse 71   Temp 98.3 F (36.8 C) (Oral)   Ht 6\' 3"  (1.905 m)   Wt 270 lb 2 oz (122.5 kg)   SpO2 97%   BMI 33.76 kg/m  General:  well developed, well nourished, in no apparent distress Skin:  no significant moles, warts, or growths Head:  no masses, lesions, or tenderness Eyes:  pupils equal and round, sclera anicteric without injection Ears:  canals without lesions, TMs shiny without retraction, no obvious effusion, no erythema Nose:  nares patent, mucosa normal Throat/Pharynx:  lips and gingiva without lesion; tongue and uvula midline; non-inflamed pharynx; no exudates or postnasal drainage Neck: neck supple without adenopathy, thyromegaly, or masses Lungs:  clear to auscultation, breath sounds equal bilaterally, no respiratory distress Cardio:  regular rate and rhythm, no bruits, no LE edema Abdomen:  abdomen soft, nontender; bowel sounds normal; no masses or organomegaly Rectal: Deferred GU: No asymmetry or external lesions, TTP over the body of the left testicle, there is no pain over the epididymis, no hernia, no nodularity or masses appreciated Musculoskeletal: Mild TTP over the medial portion of the pectorals bilaterally, mild TTP over the rhomboids and trapezius muscles bilaterally, symmetrical muscle groups noted without atrophy  or deformity Extremities:  no clubbing, cyanosis, or edema, no deformities, no skin discoloration Neuro: Negative Spurling's, intention tremor appreciated in bilateral hands, grip strength adequate bilaterally, gait normal; deep tendon reflexes normal and symmetric Psych: well oriented with normal range of affect and appropriate judgment/insight  Assessment and Plan  Well adult  exam - Plan: CBC, Comprehensive metabolic panel, Lipid panel  Chest wall pain  Strain of trapezius muscle, unspecified laterality, initial encounter  Pain in left testicle  Intention tremor   Well 41 y.o. male. Counseled on diet and exercise. Counseled on risks and benefits of prostate cancer screening with PSA. The patient agrees to forego screening.  Testicle pain: Could be a cyst, this has been going on for 16 years and he will let me know if anything changes. Stretches and exercises were provided for musculoskeletal chest wall pain and trapezius muscle irritation.  Heat, ice, Tylenol.  Physical therapy if no improvement.  He may have a benign essential tremor developing.  It does not bother him enough to consider medication so hopefully treating the above issues well improve things. Other orders as above. Follow up in 1 yr pending the above workup. The patient voiced understanding and agreement to the plan.  Jilda Roche [place], DO 01/23/22 11:18 AM

## 2022-03-03 ENCOUNTER — Encounter: Payer: 59 | Admitting: Family Medicine

## 2023-01-26 ENCOUNTER — Encounter: Payer: Self-pay | Admitting: Family Medicine

## 2023-01-26 ENCOUNTER — Ambulatory Visit (INDEPENDENT_AMBULATORY_CARE_PROVIDER_SITE_OTHER): Payer: 59 | Admitting: Family Medicine

## 2023-01-26 VITALS — BP 138/84 | HR 95 | Temp 98.0°F | Resp 16 | Ht 75.0 in | Wt 278.0 lb

## 2023-01-26 DIAGNOSIS — Z0000 Encounter for general adult medical examination without abnormal findings: Secondary | ICD-10-CM | POA: Diagnosis not present

## 2023-01-26 LAB — CBC
HCT: 49.7 % (ref 39.0–52.0)
Hemoglobin: 16.3 g/dL (ref 13.0–17.0)
MCHC: 32.7 g/dL (ref 30.0–36.0)
MCV: 86.2 fL (ref 78.0–100.0)
Platelets: 289 10*3/uL (ref 150.0–400.0)
RBC: 5.77 Mil/uL (ref 4.22–5.81)
RDW: 14.5 % (ref 11.5–15.5)
WBC: 7.3 10*3/uL (ref 4.0–10.5)

## 2023-01-26 LAB — COMPREHENSIVE METABOLIC PANEL
ALT: 15 U/L (ref 0–53)
AST: 16 U/L (ref 0–37)
Albumin: 4.7 g/dL (ref 3.5–5.2)
Alkaline Phosphatase: 57 U/L (ref 39–117)
BUN: 9 mg/dL (ref 6–23)
CO2: 28 meq/L (ref 19–32)
Calcium: 9.4 mg/dL (ref 8.4–10.5)
Chloride: 103 meq/L (ref 96–112)
Creatinine, Ser: 1.01 mg/dL (ref 0.40–1.50)
GFR: 91.07 mL/min (ref >60.00)
Glucose, Bld: 91 mg/dL (ref 70–99)
Potassium: 4.6 meq/L (ref 3.5–5.1)
Sodium: 139 meq/L (ref 135–145)
Total Bilirubin: 0.5 mg/dL (ref 0.2–1.2)
Total Protein: 7.3 g/dL (ref 6.0–8.3)

## 2023-01-26 LAB — LIPID PANEL
Cholesterol: 207 mg/dL — ABNORMAL HIGH (ref 0–200)
HDL: 52.2 mg/dL (ref >39.00)
LDL Cholesterol: 141 mg/dL — ABNORMAL HIGH (ref 0–99)
NonHDL: 155.1
Total CHOL/HDL Ratio: 4
Triglycerides: 72 mg/dL (ref 0.0–149.0)
VLDL: 14.4 mg/dL (ref 0.0–40.0)

## 2023-01-26 NOTE — Progress Notes (Signed)
Chief Complaint  Patient presents with   Annual Exam    Annual Exam    [name] is here for a complete physical.   His last physical was >1 year ago.  Current diet: in general, diet is OK.   Current exercise: walking,  Weight trend: stable Fatigue out of ordinary? No. Seat belt? Yes.   Advanced directive? No  Health maintenance Tetanus- Yes HIV- Yes Hep C- Yes  Past Medical History:  Diagnosis Date   Hypertension    Kidney stone      Past Surgical History:  Procedure Laterality Date   URETERAL STENT PLACEMENT     VASECTOMY     WISDOM TOOTH EXTRACTION      Medications  Current Outpatient Medications on File Prior to Visit  Medication Sig Dispense Refill   FLAXSEED, LINSEED, PO Take by mouth.     OVER THE COUNTER MEDICATION Take 1 mL by mouth daily. Blackseed Oil     Allergies No Known Allergies  Family History Family History  Problem Relation Age of Onset   Heart disease Mother    Hyperlipidemia Father     Review of Systems: Constitutional: no fevers or chills Eye:  no recent significant change in vision Ear/Nose/Mouth/Throat:  Ears:  no hearing loss Nose/Mouth/Throat:  no complaints of nasal congestion, no sore throat Cardiovascular:  no chest pain Respiratory:  no shortness of breath Gastrointestinal:  no abdominal pain, no change in bowel habits GU:  Male: negative for dysuria, frequency, and incontinence Musculoskeletal/Extremities:  no pain of the joints Integumentary (Skin/Breast):  no abnormal skin lesions reported Neurologic:  no headaches Endocrine: No unexpected weight changes Hematologic/Lymphatic:  no night sweats  Exam BP 138/84   Pulse 95   Temp 98 F (36.7 C) (Oral)   Resp 16   Ht 6\' 3"  (1.905 m)   Wt 278 lb (126.1 kg)   SpO2 98%   BMI 34.75 kg/m  General:  well developed, well nourished, in no apparent distress Skin:  no significant moles, warts, or growths Head:  no masses, lesions, or tenderness Eyes:  pupils  equal and round, sclera anicteric without injection Ears:  canals without lesions, TMs shiny without retraction, no obvious effusion, no erythema Nose:  nares patent, mucosa normal Throat/Pharynx:  lips and gingiva without lesion; tongue and uvula midline; non-inflamed pharynx; no exudates or postnasal drainage Neck: neck supple without adenopathy, thyromegaly, or masses Lungs:  clear to auscultation, breath sounds equal bilaterally, no respiratory distress Cardio:  regular rate and rhythm, no bruits, no LE edema Abdomen:  abdomen soft, nontender; bowel sounds normal; no masses or organomegaly Rectal: Deferred Musculoskeletal:  symmetrical muscle groups noted without atrophy or deformity Extremities:  no clubbing, cyanosis, or edema, no deformities, no skin discoloration Neuro:  gait normal; deep tendon reflexes normal and symmetric Psych: well oriented with normal range of affect and appropriate judgment/insight  Assessment and Plan  Well adult exam - Plan: CBC, Comprehensive metabolic panel, Lipid panel   Well 42 y.o. male. Counseled on diet and exercise. Advanced directive form provided today.  Flu shot politely declined.  Other orders as above. Follow up in 1 yr pending the above workup. The patient voiced understanding and agreement to the plan.  Jilda Roche [place], DO 01/26/23 12:55 PM

## 2023-01-26 NOTE — Patient Instructions (Addendum)
Give [place] 2-3 business days to get the results of your labs back.   Keep the diet clean and stay active.  Pepcid/famotidine 20 mg 1-2 times daily can help with reflux symptoms.   Let us know if you need anything.

## 2024-01-29 ENCOUNTER — Ambulatory Visit: Payer: Self-pay | Admitting: Family Medicine

## 2024-01-29 ENCOUNTER — Encounter: Payer: Self-pay | Admitting: Family Medicine

## 2024-01-29 ENCOUNTER — Encounter: Payer: 59 | Admitting: Family Medicine

## 2024-01-29 ENCOUNTER — Ambulatory Visit: Payer: 59 | Admitting: Family Medicine

## 2024-01-29 VITALS — BP 130/80 | HR 87 | Temp 98.0°F | Resp 16 | Ht 75.0 in | Wt 284.6 lb

## 2024-01-29 DIAGNOSIS — Z13220 Encounter for screening for lipoid disorders: Secondary | ICD-10-CM

## 2024-01-29 DIAGNOSIS — Z0000 Encounter for general adult medical examination without abnormal findings: Secondary | ICD-10-CM | POA: Diagnosis not present

## 2024-01-29 LAB — LIPID PANEL
Cholesterol: 200 mg/dL (ref 28–200)
HDL: 50.5 mg/dL
LDL Cholesterol: 135 mg/dL — ABNORMAL HIGH (ref 10–99)
NonHDL: 149.21
Total CHOL/HDL Ratio: 4
Triglycerides: 73 mg/dL (ref 10.0–149.0)
VLDL: 14.6 mg/dL (ref 0.0–40.0)

## 2024-01-29 LAB — COMPREHENSIVE METABOLIC PANEL WITH GFR
ALT: 18 U/L (ref 3–53)
AST: 18 U/L (ref 5–37)
Albumin: 4.6 g/dL (ref 3.5–5.2)
Alkaline Phosphatase: 50 U/L (ref 39–117)
BUN: 13 mg/dL (ref 6–23)
CO2: 30 meq/L (ref 19–32)
Calcium: 9.4 mg/dL (ref 8.4–10.5)
Chloride: 103 meq/L (ref 96–112)
Creatinine, Ser: 1.03 mg/dL (ref 0.40–1.50)
GFR: 88.32 mL/min
Glucose, Bld: 98 mg/dL (ref 70–99)
Potassium: 4.2 meq/L (ref 3.5–5.1)
Sodium: 140 meq/L (ref 135–145)
Total Bilirubin: 0.4 mg/dL (ref 0.2–1.2)
Total Protein: 7 g/dL (ref 6.0–8.3)

## 2024-01-29 LAB — CBC
HCT: 46.2 % (ref 39.0–52.0)
Hemoglobin: 15.3 g/dL (ref 13.0–17.0)
MCHC: 33 g/dL (ref 30.0–36.0)
MCV: 85.1 fl (ref 78.0–100.0)
Platelets: 291 K/uL (ref 150.0–400.0)
RBC: 5.43 Mil/uL (ref 4.22–5.81)
RDW: 14.9 % (ref 11.5–15.5)
WBC: 8.1 K/uL (ref 4.0–10.5)

## 2024-01-29 NOTE — Progress Notes (Signed)
 Chief Complaint  Patient presents with   Annual Exam    CPE    [name] is here for a complete physical.   His last physical was >1 year ago.  Current diet: in general, a healthy diet.   Current exercise: calisthenics Weight trend: increased a little Fatigue out of ordinary? No. Seat belt? Yes.   Advanced directive? No  Health maintenance Tetanus- Yes HIV- Yes Hep C- Yes  Past Medical History:  Diagnosis Date   Hypertension    Kidney stone      Past Surgical History:  Procedure Laterality Date   URETERAL STENT PLACEMENT     VASECTOMY     WISDOM TOOTH EXTRACTION      Medications  Medications Ordered Prior to Encounter[1]   Allergies Allergies[2]  Family History Family History  Problem Relation Age of Onset   Heart disease Mother    Hyperlipidemia Father     Review of Systems: Constitutional: no fevers or chills Eye:  no recent significant change in vision Ear/Nose/Mouth/Throat:  Ears:  no hearing loss Nose/Mouth/Throat:  no complaints of nasal congestion, no sore throat Cardiovascular:  no chest pain Respiratory:  no shortness of breath Gastrointestinal:  no abdominal pain, no change in bowel habits GU:  Male: negative for dysuria, frequency, and incontinence Musculoskeletal/Extremities:  no pain of the joints Integumentary (Skin/Breast):  no abnormal skin lesions reported Neurologic:  no headaches Endocrine: No unexpected weight changes Hematologic/Lymphatic:  no night sweats  Exam BP 130/80 (BP Location: Left Arm, Patient Position: Sitting)   Pulse 87   Temp 98 F (36.7 C) (Oral)   Resp 16   Ht 6' 3 (1.905 m)   Wt 284 lb 9.6 oz (129.1 kg)   SpO2 98%   BMI 35.57 kg/m  General:  well developed, well nourished, in no apparent distress Skin:  no significant moles, warts, or growths Head:  no masses, lesions, or tenderness Eyes:  pupils equal and round, sclera anicteric without injection Ears:  canals without lesions, TMs shiny  without retraction, no obvious effusion, no erythema Nose:  nares patent, mucosa normal Throat/Pharynx:  lips and gingiva without lesion; tongue and uvula midline; non-inflamed pharynx; no exudates or postnasal drainage Neck: neck supple without adenopathy, thyromegaly, or masses Lungs:  clear to auscultation, breath sounds equal bilaterally, no respiratory distress Cardio:  regular rate and rhythm, no bruits, no LE edema Abdomen:  abdomen soft, nontender; bowel sounds normal; no masses or organomegaly Rectal: Deferred Musculoskeletal:  symmetrical muscle groups noted without atrophy or deformity Extremities:  no clubbing, cyanosis, or edema, no deformities, no skin discoloration Neuro:  gait normal; deep tendon reflexes normal and symmetric Psych: well oriented with normal range of affect and appropriate judgment/insight  Assessment and Plan  Well adult exam - Plan: CBC, Comprehensive metabolic panel with GFR, Lipid panel, Hepatitis B surface antibody,quantitative   Well 43 y.o. male. Counseled on diet and exercise. Advanced directive form provided today.  Flu shot politely declined.  Screen Hep B. Other orders as above. Follow up in 1 yr pending the above workup. The patient voiced understanding and agreement to the plan.  Ryan [place], DO 01/29/2024 8:19 AM     [1]  Current Outpatient Medications on File Prior to Visit  Medication Sig Dispense Refill   FLAXSEED, LINSEED, PO Take by mouth.     OVER THE COUNTER MEDICATION Take 1 mL by mouth daily. Blackseed Oil     No current facility-administered medications on file prior  to visit.  [2] No Known Allergies

## 2024-01-29 NOTE — Patient Instructions (Addendum)
 Make healthy eating choices.   Aim to do some physical exertion for 150 minutes per week. This is typically divided into 5 days per week, 30 minutes per day. The activity should be enough to get your heart rate up. Anything is better than nothing if you have time constraints.  Please get me a copy of your advanced directive form at your convenience.   Let us  know if you need anything.

## 2024-01-30 LAB — HEPATITIS B SURFACE ANTIBODY, QUANTITATIVE: Hep B S AB Quant (Post): <5 m[IU]/mL — ABNORMAL LOW

## 2025-02-02 ENCOUNTER — Encounter: Admitting: Family Medicine
